# Patient Record
Sex: Male | Born: 1945 | Race: White | Hispanic: Refuse to answer | Marital: Married | Smoking: Never smoker
Health system: Southern US, Community
[De-identification: ages and names within clinical notes are randomized; demographics above are authoritative.]

## PROBLEM LIST (undated history)

## (undated) DIAGNOSIS — K5792 Diverticulitis of intestine, part unspecified, without perforation or abscess without bleeding: Secondary | ICD-10-CM

## (undated) DIAGNOSIS — E119 Type 2 diabetes mellitus without complications: Secondary | ICD-10-CM

## (undated) HISTORY — PX: APPENDECTOMY: SHX54

## (undated) HISTORY — DX: Type 2 diabetes mellitus without complications: E11.9

---

## 2002-05-19 ENCOUNTER — Encounter: Payer: Self-pay | Admitting: Internal Medicine

## 2002-05-19 ENCOUNTER — Encounter: Admission: RE | Admit: 2002-05-19 | Discharge: 2002-05-19 | Payer: Self-pay | Admitting: Internal Medicine

## 2004-05-13 ENCOUNTER — Ambulatory Visit (HOSPITAL_COMMUNITY): Admission: RE | Admit: 2004-05-13 | Discharge: 2004-05-13 | Payer: Self-pay

## 2005-09-16 ENCOUNTER — Encounter: Admission: RE | Admit: 2005-09-16 | Discharge: 2005-09-16 | Payer: Self-pay | Admitting: Rheumatology

## 2007-12-25 ENCOUNTER — Encounter: Admission: RE | Admit: 2007-12-25 | Discharge: 2007-12-25 | Payer: Self-pay | Admitting: Internal Medicine

## 2008-07-08 ENCOUNTER — Encounter: Admission: RE | Admit: 2008-07-08 | Discharge: 2008-07-08 | Payer: Self-pay | Admitting: Orthopedic Surgery

## 2009-05-13 ENCOUNTER — Encounter: Admission: RE | Admit: 2009-05-13 | Discharge: 2009-05-13 | Payer: Self-pay | Admitting: Sports Medicine

## 2009-12-18 HISTORY — PX: TOTAL KNEE ARTHROPLASTY: SHX125

## 2011-03-09 ENCOUNTER — Other Ambulatory Visit: Payer: Self-pay | Admitting: Orthopedic Surgery

## 2011-03-09 ENCOUNTER — Encounter (HOSPITAL_COMMUNITY): Payer: 59 | Attending: Orthopedic Surgery

## 2011-03-09 DIAGNOSIS — Z79899 Other long term (current) drug therapy: Secondary | ICD-10-CM | POA: Insufficient documentation

## 2011-03-09 DIAGNOSIS — Z01812 Encounter for preprocedural laboratory examination: Secondary | ICD-10-CM | POA: Insufficient documentation

## 2011-03-09 LAB — CBC
HCT: 38.6 % — ABNORMAL LOW (ref 39.0–52.0)
Hemoglobin: 12.1 g/dL — ABNORMAL LOW (ref 13.0–17.0)
MCH: 26.9 pg (ref 26.0–34.0)
MCHC: 31.3 g/dL (ref 30.0–36.0)
MCV: 85.8 fL (ref 78.0–100.0)
Platelets: 252 10*3/uL (ref 150–400)
RBC: 4.5 MIL/uL (ref 4.22–5.81)
RDW: 14.2 % (ref 11.5–15.5)
WBC: 6.1 10*3/uL (ref 4.0–10.5)

## 2011-03-09 LAB — SURGICAL PCR SCREEN: MRSA, PCR: NEGATIVE

## 2011-03-09 LAB — DIFFERENTIAL
Basophils Absolute: 0 10*3/uL (ref 0.0–0.1)
Eosinophils Absolute: 0.1 10*3/uL (ref 0.0–0.7)
Eosinophils Relative: 2 % (ref 0–5)

## 2011-03-09 LAB — BASIC METABOLIC PANEL
CO2: 27 mEq/L (ref 19–32)
Chloride: 105 mEq/L (ref 96–112)
Creatinine, Ser: 1.11 mg/dL (ref 0.4–1.5)
GFR calc Af Amer: >60 mL/min (ref >60)

## 2011-03-09 LAB — URINALYSIS, ROUTINE W REFLEX MICROSCOPIC
Glucose, UA: 100 mg/dL — AB
Ketones, ur: NEGATIVE mg/dL
Nitrite: NEGATIVE
Protein, ur: NEGATIVE mg/dL

## 2011-03-09 NOTE — H&P (Signed)
Bradley Gilmore, Bradley Gilmore             ACCOUNT NO.:  [id number]  MEDICAL RECORD NO.:  [id number]           PATIENT TYPE:  I  LOCATION:  1S                           FACILITY:  [facility]  PHYSICIAN:  Madlyn Frankel. Charlann Boxer, M.D.  DATE OF BIRTH:  1946-07-28  DATE OF ADMISSION: DATE OF DISCHARGE:                             HISTORY & PHYSICAL   ADMITTING DIAGNOSIS:  Left knee osteoarthritis.  HISTORY:  Mr. Messer is a 65 year old gentleman seen and evaluated for left knee osteoarthritis.  He had been seen and evaluated for this in the past and is here for surgical consideration after previous MRI revealed some meniscal pathology.  His radiographs revealed advanced degenerative changes with bone on bone arthritis.  I do not think that any other surgery or the knee arthroplasty will be of benefit.  After reviewing with him, he wished to proceed in this fashion as he was not getting better.  He was subsequently medically cleared.  PAST MEDICAL HISTORY:  Diabetes.  PAST SURGICAL HISTORY: 1. Appendectomy. 2. Orbital repair. 3. Cataract surgery. 4. Wound and shrapnel injuries as well as repair and thumb     reattachment. 5. History of left knee arthroscopy.  FAMILY MEDICAL HISTORY:  Hypertension and diabetes.  CURRENT MEDICATIONS:  Actos, metformin, aspirin, glimepiride, simvastatin, diclofenac.  DRUG ALLERGIES:  No known drug allergies.  PRIMARY CARE PHYSICIAN:  Dr. Benjaman Kindler.  SOCIAL HISTORY:  He denies smoking and alcohol.  The patient had been seen and evaluated by his primary physician in December 2011 and since that time had no major complicating issues.  No headaches, dizziness, blurred vision, slurred speech.  No rhinitis, productive cough, hemoptysis, no chest pains, no abdominal discomfort. No nausea, vomiting, burning or frequent urination.  No constipation or diarrhea.  His only major issue at this point was his left knee pain.  PHYSICAL EXAMINATION:  The patient was seen  and evaluated in the office. VITAL SIGNS:  Noted to have a pulse of 78, blood pressure of 142/80. GENERAL:  Otherwise, he is awake, alert, oriented, cooperative.  Denies any assist device for ambulation. HEAD AND NECK EXAM:  Normal with no slurred speech or obvious vision issues. CHEST:  His chest was clear to auscultation. HEART:  His heart had regular rate and rhythm. ABDOMEN:  His abdomen was soft and nontender. EXTREMITY EXAMINATION:  Revealed slight genu varum to this left knee with tenderness medially.  He had near full extension with some crepitation and pain.  His knee ligaments were otherwise stable.  He was neurovascularly intact in the left lower extremity.  RADIOGRAPHS:  Radiographs and EKG performed at [facility] are pending. Radiographs from our office of his knees are available for presurgical consideration with advanced left knee osteoarthritis.  ASSESSMENT:  Advanced left knee osteoarthritis.  PLAN:  The patient will be admitted for same-day surgery on March 20, 2011.  After reviewing with him his current situation as well as his insurance options, we will plan on him doing outpatient physical therapy when he is discharged to home.  He will be placed on postoperative medication including pain medicine. His prescriptions were provided for standard  Colace, MiraLax, iron, Xarelto, Robaxin.  He will receive pain medicine in the hospital. Questions were encouraged, answered and reviewed with him.     Madlyn Frankel Charlann Boxer, M.D.     MDO/MEDQ  D:  03/08/2011  T:  03/09/2011  Job:  [id number]  Electronically Signed by Durene Romans M.D. on 03/09/2011 01:31:49 PM

## 2011-03-10 ENCOUNTER — Other Ambulatory Visit (HOSPITAL_COMMUNITY): Payer: Self-pay

## 2011-03-20 ENCOUNTER — Inpatient Hospital Stay (HOSPITAL_COMMUNITY)
Admission: RE | Admit: 2011-03-20 | Discharge: 2011-03-23 | DRG: 470 | Disposition: A | Discharge status: Home Health | Payer: 59 | Source: Ambulatory Visit | Attending: Orthopedic Surgery | Admitting: Orthopedic Surgery

## 2011-03-20 DIAGNOSIS — M1710 Unilateral primary osteoarthritis, unspecified knee: Principal | ICD-10-CM | POA: Diagnosis present

## 2011-03-20 DIAGNOSIS — E119 Type 2 diabetes mellitus without complications: Secondary | ICD-10-CM | POA: Diagnosis present

## 2011-03-20 DIAGNOSIS — Z01812 Encounter for preprocedural laboratory examination: Secondary | ICD-10-CM

## 2011-03-20 LAB — GLUCOSE, CAPILLARY
Glucose-Capillary: 155 mg/dL — ABNORMAL HIGH (ref 70–99)
Glucose-Capillary: 127 mg/dL — ABNORMAL HIGH (ref 70–99)

## 2011-03-20 LAB — TYPE AND SCREEN
ABO/RH(D): A POS
Antibody Screen: NEGATIVE

## 2011-03-20 LAB — ABO/RH: ABO/RH(D): A POS

## 2011-03-21 LAB — BASIC METABOLIC PANEL
BUN: 12 mg/dL (ref 6–23)
Chloride: 107 mEq/L (ref 96–112)
Creatinine, Ser: 1.05 mg/dL (ref 0.4–1.5)

## 2011-03-21 LAB — GLUCOSE, CAPILLARY
Glucose-Capillary: 169 mg/dL — ABNORMAL HIGH (ref 70–99)
Glucose-Capillary: 157 mg/dL — ABNORMAL HIGH (ref 70–99)
Glucose-Capillary: 174 mg/dL — ABNORMAL HIGH (ref 70–99)

## 2011-03-21 LAB — CBC
MCH: 27.2 pg (ref 26.0–34.0)
MCV: 86.4 fL (ref 78.0–100.0)
Platelets: 197 10*3/uL (ref 150–400)
RBC: 3.75 MIL/uL — ABNORMAL LOW (ref 4.22–5.81)
RDW: 14.2 % (ref 11.5–15.5)

## 2011-03-22 LAB — GLUCOSE, CAPILLARY
Glucose-Capillary: 155 mg/dL — ABNORMAL HIGH (ref 70–99)
Glucose-Capillary: 162 mg/dL — ABNORMAL HIGH (ref 70–99)

## 2011-03-22 LAB — CBC
HCT: 32.6 % — ABNORMAL LOW (ref 39.0–52.0)
Platelets: 204 10*3/uL (ref 150–400)
RDW: 14.1 % (ref 11.5–15.5)
WBC: 7.7 10*3/uL (ref 4.0–10.5)

## 2011-03-22 LAB — BASIC METABOLIC PANEL
GFR calc Af Amer: >60 mL/min (ref >60)
GFR calc non Af Amer: >60 mL/min (ref >60)
Potassium: 4.4 mEq/L (ref 3.5–5.1)
Sodium: 136 mEq/L (ref 135–145)

## 2011-03-25 NOTE — Discharge Summary (Signed)
Bradley Gilmore, Bradley Gilmore             ACCOUNT NO.:  [id number]  MEDICAL RECORD NO.:  [id number]           PATIENT TYPE:  I  LOCATION:  1604                         FACILITY:  [facility]  PHYSICIAN:  Madlyn Frankel. Charlann Boxer, M.D.  DATE OF BIRTH:  04-12-1946  DATE OF ADMISSION:  03/20/2011 DATE OF DISCHARGE:  03/22/2011                              DISCHARGE SUMMARY   ADMITTING DIAGNOSIS:  Left knee osteoarthritis.  DISCHARGE DIAGNOSES: 1. Left knee osteoarthritis, status post left total knee replacement,     March 20, 2011. 2. Diabetes.  ADMITTING HISTORY:  Bradley Gilmore is a pleasant 66 year old gentleman who presented to the office for evaluation of advanced left knee osteoarthritis and pain.  He had failed conservative measures.  After reviewing with him the surgical options available, he wished to proceed with knee arthroplasty.  Risks and benefits were discussed and reviewed, consent was obtained for benefit of pain relief.  HOSPITAL COURSE:  The patient was admitted for same-day surgery on March 20, 2011.  He underwent a left total knee replacement.  Please see dictated operative note for full details of the procedure. Postoperatively after a brief stay in recovery room, he was transferred to the orthopedic ward where he remained throughout his hospital stay.  On postop day #1, his Hemovac drain was removed, after 24 hours his Foley was removed.  He was seen and evaluated by Physical Therapy.  He was placed on a diabetic diet.  By postop day #1, his hematocrit was 32.4; on day #2, it was 32.6.  His electrolytes remained stable. He was seen and evaluated by Physical Therapy and prepared for discharge on day #2.  DISCHARGE INSTRUCTIONS:  He will be discharged home with home health physical therapy to work on range of motion, strengthening, gait training.  He will work on Banker range of motion and strength.  He may shower.  His current Aquacel dressing will remain in place for 8 days,  it is waterproof and he may shower with it on.  Dry dressings will otherwise be applied.  He will return to see Dr. Durene Romans at [facility], at [phone], in 2 weeks' times.  If he has any questions they can be addressed at our office.  DISCHARGE MEDICATIONS: 1. Colace 100 mg p.o. b.i.d. for constipation while on pain medicines. 2. Aspirin 325 mg p.o. b.i.d. for 30 days and he will return to his 81     mg dose. 3. Robaxin 500 mg p.o. q.6 h. as needed for muscle spasm and pain. 4. Iron 325 mg p.o. 2-3 times a day as needed for 1-2 weeks. 5. Oxycodone 5 mg 1-3 tablets every 4-6 hours as needed for pain. 6. MiraLax 17 g p.o. daily as needed for constipation while on pain     medicines. 7. Actos 45 mg p.o. q.a.m. 8. Metformin 500 mg 2 tablets b.i.d. 9. Omeprazole 20 mg daily. 10.Simvastatin 20 mg nightly. 11.Vitamin D2 50,000 units weekly.     Madlyn Frankel Charlann Boxer, M.D.     MDO/MEDQ  D:  03/22/2011  T:  03/23/2011  Job:  [id number]  Electronically Signed by Durene Romans  M.D. on 03/25/2011 10:00:12 AM

## 2011-03-25 NOTE — Op Note (Signed)
NAMEMONTELL, LEOPARD             ACCOUNT NO.:  [id number]  MEDICAL RECORD NO.:  [id number]           PATIENT TYPE:  I  LOCATION:  0005                         FACILITY:  [facility]  PHYSICIAN:  Madlyn Frankel. Charlann Boxer, M.D.  DATE OF BIRTH:  [birth year]  DATE OF PROCEDURE:  03/20/2011 DATE OF DISCHARGE:                              OPERATIVE REPORT   PREOPERATIVE DIAGNOSES:  Left knee osteoarthritis.  POSTOPERATIVE DIAGNOSIS:  Left knee osteoarthritis.  PROCEDURE:  Left total-knee replacement.  COMPONENTS USED:  DePuy rotating platform posterior stabilized knee system with a size 4 femur, 4 tibia, 10-mm insert and a 41 patellar button.  SURGEON:  Madlyn Frankel. Charlann Boxer, M.D.  ASSISTANT:  Jaquelyn Bitter. Chabon, PA-C  ANESTHESIA:  Spinal.  SPECIMENS:  None.  COMPLICATIONS:  None.  DRAINS:  One Hemovac.  TOURNIQUET TIME:  40 minutes at 250 mmHg.  ESTIMATED BLOOD LOSS:  About 50 cc.  INDICATIONS OF THE PROCEDURE:  Mr. Richison is a 65 year old gentleman who presented to the office for evaluation of left knee pain.  Radiographs revealed degenerative changes.  He had failed to respond to conservative measures and is wishing to proceed with more definitive measures.  We discussed options of partial versus total-knee replacement and he wished to proceed with total-knee replacement.  The risks of infection, DVT, component failure, and need for revision surgery were all reviewed in the setting of the potential for benefit of pain relief.  Consent was obtained.  PROCEDURE IN DETAIL:  The patient was brought to the operative theater. Once adequate anesthesia, preoperative antibiotics, Ancef administered as well as tranexamic acid for perioperative blood loss, he was positioned supine with a right thigh tourniquet placed.  The right lower extremity was then prepped and draped in the sterile fashion with his right leg placed in the [facility] leg holder.  A time-out was performed identifying the patient,  planned procedure and extremity.  The leg was exsanguinated.  A midline incision was made followed by a median arthrotomy following initial exposure and debridement.  Attention was directed to the patella.  Precut measurement was 23 mm, resected down to about 14 mm and the 41 patellar button seemed to restore the height the best as well as provide the best coverage.  Lug holes were drilled with a metal shim placed to protect the patella from retractors and saw blades.  Attention was now directed to the femur.  The femoral canal was opened with the drill and irrigated to try to prevent fat emboli.  An intramedullary rod was passed and at 5 degrees of valgus 10 mm of bone was resected off the distal femur.  Following this resection, the tibia was subluxated anteriorly and using an extramedullary guide I had a measured resection of 10 mm off the proximal lateral tibia.  The tibial bone was removed in addition to cruciate stumps and remaining meniscus. At this point, we confirmed that the gap was going to be stable with a 10-mm extension block and that the cut was perpendicular in the coronal plane.  Once this was done, I sized the femur to be a size 4 in the anterior and  posterior dimension.  The size 4 rotation block was pinned into position anterior reference using a C-clamp off the proximal tibia to set rotation.  The 4-in-1 cutting block was placed after adjusting it and dropping it down a bit posterior in an effort to improve flexion gap.  The anterior, posterior and chamfer cuts were then made without difficulty nor notching.  Final box cut was made off the lateral aspect of the distal femur.  The tibia was subluxating again anteriorly.  The size 4 tray seemed to fit best, particularly after removing medial osteophytes.  It was pinned into position, drilled and keel punched.  A trial reduction was carried out with a 4 femur, 4 tibia, and a 10-mm insert.  The knee was noted to come  to full extension with stable ligaments and the patella tracked through the trochlea without the application of pressure.  Given these findings, all trial components were removed.  The synovial capsule junction of the knee was debrided as well as injected with 0.25% Marcaine with epinephrine and 1 cc of Toradol.  The knee was irrigated with normal saline solution as the final components were opened and the cement mixed.  The final components were then cemented onto clean and dried cut surface of the bone.  Extruded cement was removed.  Once the cement had fully cured, the final 10-mm insert was chosen and inserted into the knee. The knee was re-irrigated with normal saline solution.  The tourniquet had been let down at 40 minutes without significant hemostasis required. A medium Hemovac drain was placed deep.  The extensor mechanism was then reapproximated with the knee in flexion.  The remainder of the wound was closed in layers with 2-0 Vicryl and running 4-0 Monocryl.  The knee was cleaned, dried and dressed sterilely with an Aquacel dressing.  The drain site was dressed separately.  He was then brought to the recovery room in stable condition, tolerating the procedure well.     Madlyn Frankel Charlann Boxer, M.D.     MDO/MEDQ  D:  03/20/2011  T:  03/20/2011  Job:  [id number]  Electronically Signed by Durene Romans M.D. on 03/25/2011 10:00:08 AM

## 2011-05-28 ENCOUNTER — Emergency Department (HOSPITAL_COMMUNITY): Payer: 59

## 2011-05-28 ENCOUNTER — Emergency Department (HOSPITAL_COMMUNITY)
Admission: EM | Admit: 2011-05-28 | Discharge: 2011-05-28 | Disposition: A | Discharge status: Home / Self Care | Payer: 59 | Attending: Emergency Medicine | Admitting: Emergency Medicine

## 2011-05-28 DIAGNOSIS — T148XXA Other injury of unspecified body region, initial encounter: Secondary | ICD-10-CM | POA: Insufficient documentation

## 2011-05-28 DIAGNOSIS — Z96659 Presence of unspecified artificial knee joint: Secondary | ICD-10-CM | POA: Insufficient documentation

## 2011-05-28 DIAGNOSIS — X58XXXA Exposure to other specified factors, initial encounter: Secondary | ICD-10-CM | POA: Insufficient documentation

## 2011-05-28 DIAGNOSIS — M79609 Pain in unspecified limb: Secondary | ICD-10-CM | POA: Insufficient documentation

## 2011-05-28 DIAGNOSIS — M7989 Other specified soft tissue disorders: Secondary | ICD-10-CM | POA: Insufficient documentation

## 2011-12-07 ENCOUNTER — Other Ambulatory Visit: Payer: Self-pay | Admitting: Internal Medicine

## 2011-12-07 DIAGNOSIS — J329 Chronic sinusitis, unspecified: Secondary | ICD-10-CM

## 2011-12-08 ENCOUNTER — Ambulatory Visit
Admission: RE | Admit: 2011-12-08 | Discharge: 2011-12-08 | Disposition: A | Discharge status: Home / Self Care | Payer: 59 | Source: Ambulatory Visit | Attending: Internal Medicine | Admitting: Internal Medicine

## 2011-12-08 DIAGNOSIS — J329 Chronic sinusitis, unspecified: Secondary | ICD-10-CM

## 2012-07-23 ENCOUNTER — Encounter (HOSPITAL_COMMUNITY): Payer: Self-pay | Admitting: *Deleted

## 2012-07-23 ENCOUNTER — Emergency Department (HOSPITAL_COMMUNITY): Payer: 59

## 2012-07-23 ENCOUNTER — Emergency Department (HOSPITAL_COMMUNITY)
Admission: EM | Admit: 2012-07-23 | Discharge: 2012-07-23 | Disposition: A | Discharge status: Home / Self Care | Payer: 59 | Attending: Emergency Medicine | Admitting: Emergency Medicine

## 2012-07-23 DIAGNOSIS — N200 Calculus of kidney: Secondary | ICD-10-CM | POA: Insufficient documentation

## 2012-07-23 DIAGNOSIS — Z7982 Long term (current) use of aspirin: Secondary | ICD-10-CM | POA: Insufficient documentation

## 2012-07-23 DIAGNOSIS — Z9089 Acquired absence of other organs: Secondary | ICD-10-CM | POA: Insufficient documentation

## 2012-07-23 DIAGNOSIS — Z79899 Other long term (current) drug therapy: Secondary | ICD-10-CM | POA: Insufficient documentation

## 2012-07-23 DIAGNOSIS — R109 Unspecified abdominal pain: Secondary | ICD-10-CM | POA: Insufficient documentation

## 2012-07-23 DIAGNOSIS — E119 Type 2 diabetes mellitus without complications: Secondary | ICD-10-CM | POA: Insufficient documentation

## 2012-07-23 HISTORY — DX: Diverticulitis of intestine, part unspecified, without perforation or abscess without bleeding: K57.92

## 2012-07-23 LAB — URINALYSIS, ROUTINE W REFLEX MICROSCOPIC
Bilirubin Urine: NEGATIVE
Nitrite: NEGATIVE
Protein, ur: NEGATIVE mg/dL
Specific Gravity, Urine: 1.025 (ref 1.005–1.030)
Urobilinogen, UA: 0.2 mg/dL (ref 0.0–1.0)

## 2012-07-23 LAB — POCT I-STAT, CHEM 8
Calcium, Ion: 1.21 mmol/L (ref 1.13–1.30)
Chloride: 107 mEq/L (ref 96–112)
Glucose, Bld: 243 mg/dL — ABNORMAL HIGH (ref 70–99)
HCT: 35 % — ABNORMAL LOW (ref 39.0–52.0)
TCO2: 21 mmol/L (ref 0–100)

## 2012-07-23 LAB — CBC
Hemoglobin: 11 g/dL — ABNORMAL LOW (ref 13.0–17.0)
MCH: 24.9 pg — ABNORMAL LOW (ref 26.0–34.0)
MCV: 78.9 fL (ref 78.0–100.0)
RBC: 4.41 MIL/uL (ref 4.22–5.81)
WBC: 8.3 10*3/uL (ref 4.0–10.5)

## 2012-07-23 LAB — COMPREHENSIVE METABOLIC PANEL
ALT: 12 U/L (ref 0–53)
CO2: 23 mEq/L (ref 19–32)
Calcium: 9.2 mg/dL (ref 8.4–10.5)
Chloride: 105 mEq/L (ref 96–112)
Creatinine, Ser: 1.51 mg/dL — ABNORMAL HIGH (ref 0.50–1.35)
GFR calc Af Amer: 54 mL/min — ABNORMAL LOW (ref >90)
GFR calc non Af Amer: 47 mL/min — ABNORMAL LOW (ref >90)
Glucose, Bld: 251 mg/dL — ABNORMAL HIGH (ref 70–99)
Total Bilirubin: 0.3 mg/dL (ref 0.3–1.2)

## 2012-07-23 LAB — URINE MICROSCOPIC-ADD ON

## 2012-07-23 MED ORDER — SODIUM CHLORIDE 0.9 % IV SOLN
INTRAVENOUS | Status: DC
  Administered 2012-07-23: 05:00:00 via INTRAVENOUS

## 2012-07-23 MED ORDER — TAMSULOSIN HCL 0.4 MG PO CAPS: 0.4000 mg | ORAL_CAPSULE | Freq: Every day | ORAL | Status: DC

## 2012-07-23 MED ORDER — ONDANSETRON HCL 4 MG PO TABS: 4.0000 mg | ORAL_TABLET | Freq: Four times a day (QID) | ORAL | Status: DC

## 2012-07-23 MED ORDER — ONDANSETRON HCL 4 MG/2ML IJ SOLN
4.0000 mg | Freq: Once | INTRAMUSCULAR | Status: AC
  Administered 2012-07-23: 4 mg via INTRAVENOUS
  Filled 2012-07-23: qty 2

## 2012-07-23 MED ORDER — HYDROMORPHONE HCL PF 1 MG/ML IJ SOLN
1.0000 mg | Freq: Once | INTRAMUSCULAR | Status: AC
  Administered 2012-07-23: 1 mg via INTRAVENOUS
  Filled 2012-07-23: qty 1

## 2012-07-23 MED ORDER — OXYCODONE-ACETAMINOPHEN 5-325 MG PO TABS: 2.0000 | ORAL_TABLET | ORAL | Status: DC | PRN

## 2012-07-23 MED ORDER — SODIUM CHLORIDE 0.9 % IV BOLUS (SEPSIS)
1000.0000 mL | Freq: Once | INTRAVENOUS | Status: AC
  Administered 2012-07-23: 1000 mL via INTRAVENOUS

## 2012-07-23 NOTE — ED Notes (Signed)
Pt c/o acute onset abd pain that awoke him from his sleep at 2 am.  Pt with dx with diverticulitis to RLQ 2 months ago.  Pt denies emesis or constipation, but states loose stool x 3.

## 2012-07-24 ENCOUNTER — Inpatient Hospital Stay (HOSPITAL_COMMUNITY)
Admission: EM | Admit: 2012-07-24 | Discharge: 2012-07-25 | DRG: 694 | Disposition: A | Discharge status: Home / Self Care | Payer: 59 | Attending: Urology | Admitting: Urology

## 2012-07-24 ENCOUNTER — Encounter (HOSPITAL_COMMUNITY): Payer: Self-pay | Admitting: Emergency Medicine

## 2012-07-24 DIAGNOSIS — N201 Calculus of ureter: Principal | ICD-10-CM | POA: Diagnosis present

## 2012-07-24 DIAGNOSIS — N200 Calculus of kidney: Secondary | ICD-10-CM

## 2012-07-24 DIAGNOSIS — E119 Type 2 diabetes mellitus without complications: Secondary | ICD-10-CM | POA: Diagnosis present

## 2012-07-24 DIAGNOSIS — Z8719 Personal history of other diseases of the digestive system: Secondary | ICD-10-CM

## 2012-07-24 DIAGNOSIS — N1330 Unspecified hydronephrosis: Secondary | ICD-10-CM | POA: Diagnosis present

## 2012-07-24 DIAGNOSIS — Z96659 Presence of unspecified artificial knee joint: Secondary | ICD-10-CM

## 2012-07-24 DIAGNOSIS — N179 Acute kidney failure, unspecified: Secondary | ICD-10-CM | POA: Diagnosis present

## 2012-07-24 LAB — URINALYSIS, ROUTINE W REFLEX MICROSCOPIC
Glucose, UA: NEGATIVE mg/dL
Hgb urine dipstick: NEGATIVE
Leukocytes, UA: NEGATIVE
pH: 5.5 (ref 5.0–8.0)

## 2012-07-24 LAB — COMPREHENSIVE METABOLIC PANEL
ALT: 10 U/L (ref 0–53)
AST: 15 U/L (ref 0–37)
CO2: 22 mEq/L (ref 19–32)
Calcium: 9.2 mg/dL (ref 8.4–10.5)
Creatinine, Ser: 1.9 mg/dL — ABNORMAL HIGH (ref 0.50–1.35)
GFR calc Af Amer: 41 mL/min — ABNORMAL LOW (ref >90)
GFR calc non Af Amer: 35 mL/min — ABNORMAL LOW (ref >90)
Sodium: 129 mEq/L — ABNORMAL LOW (ref 135–145)
Total Protein: 7.2 g/dL (ref 6.0–8.3)

## 2012-07-24 LAB — CBC WITH DIFFERENTIAL/PLATELET
Basophils Absolute: 0 10*3/uL (ref 0.0–0.1)
Basophils Relative: 0 % (ref 0–1)
Eosinophils Absolute: 0 10*3/uL (ref 0.0–0.7)
Eosinophils Relative: 0 % (ref 0–5)
HCT: 34.4 % — ABNORMAL LOW (ref 39.0–52.0)
Hemoglobin: 11.1 g/dL — ABNORMAL LOW (ref 13.0–17.0)
Lymphocytes Relative: 13 % (ref 12–46)
Lymphs Abs: 1.3 10*3/uL (ref 0.7–4.0)
MCH: 25.2 pg — ABNORMAL LOW (ref 26.0–34.0)
MCHC: 32.3 g/dL (ref 30.0–36.0)
MCV: 78 fL (ref 78.0–100.0)
Monocytes Absolute: 0.7 10*3/uL (ref 0.1–1.0)
Monocytes Relative: 7 % (ref 3–12)
Neutro Abs: 8.1 10*3/uL — ABNORMAL HIGH (ref 1.7–7.7)
Neutrophils Relative %: 80 % — ABNORMAL HIGH (ref 43–77)
Platelets: 251 10*3/uL (ref 150–400)
RBC: 4.41 MIL/uL (ref 4.22–5.81)
RDW: 15.8 % — ABNORMAL HIGH (ref 11.5–15.5)
WBC: 10.2 10*3/uL (ref 4.0–10.5)

## 2012-07-24 MED ORDER — SODIUM CHLORIDE 0.9 % IV SOLN
Freq: Once | INTRAVENOUS | Status: AC
  Administered 2012-07-24: 23:00:00 via INTRAVENOUS

## 2012-07-24 MED ORDER — ONDANSETRON HCL 4 MG/2ML IJ SOLN
4.0000 mg | Freq: Once | INTRAMUSCULAR | Status: AC
  Administered 2012-07-24: 4 mg via INTRAVENOUS
  Filled 2012-07-24: qty 2

## 2012-07-24 MED ORDER — FENTANYL CITRATE 0.05 MG/ML IJ SOLN
50.0000 ug | Freq: Once | INTRAMUSCULAR | Status: AC
  Administered 2012-07-24: 50 ug via INTRAVENOUS
  Filled 2012-07-24: qty 2

## 2012-07-24 NOTE — ED Notes (Signed)
Pt alert, arrives from home, c/o right flank pain and fever, onset a few days ago, instructed to come to ED per Urology, resp even unlabored, skin pwd

## 2012-07-25 ENCOUNTER — Encounter (HOSPITAL_COMMUNITY)
Admission: EM | Disposition: A | Discharge status: Home / Self Care | Payer: Self-pay | Source: Home / Self Care | Attending: Urology

## 2012-07-25 ENCOUNTER — Emergency Department (HOSPITAL_COMMUNITY): Payer: 59 | Admitting: Anesthesiology

## 2012-07-25 ENCOUNTER — Encounter (HOSPITAL_COMMUNITY): Payer: Self-pay | Admitting: Anesthesiology

## 2012-07-25 HISTORY — PX: CYSTOSCOPY/RETROGRADE/URETEROSCOPY: SHX5316

## 2012-07-25 LAB — GLUCOSE, CAPILLARY: Glucose-Capillary: 141 mg/dL — ABNORMAL HIGH (ref 70–99)

## 2012-07-25 LAB — BASIC METABOLIC PANEL
BUN: 18 mg/dL (ref 6–23)
Chloride: 99 mEq/L (ref 96–112)
Creatinine, Ser: 1.7 mg/dL — ABNORMAL HIGH (ref 0.50–1.35)
GFR calc Af Amer: 47 mL/min — ABNORMAL LOW (ref >90)
GFR calc non Af Amer: 41 mL/min — ABNORMAL LOW (ref >90)
Potassium: 4.4 mEq/L (ref 3.5–5.1)

## 2012-07-25 SURGERY — CYSTOSCOPY, WITH STENT INSERTION
Anesthesia: General | Site: Ureter | Laterality: Right | Wound class: Clean Contaminated

## 2012-07-25 MED ORDER — CIPROFLOXACIN HCL 250 MG PO TABS: 250.0000 mg | ORAL_TABLET | Freq: Two times a day (BID) | ORAL | Status: AC

## 2012-07-25 MED ORDER — CEFAZOLIN SODIUM-DEXTROSE 2-3 GM-% IV SOLR
INTRAVENOUS | Status: AC
  Filled 2012-07-25: qty 50

## 2012-07-25 MED ORDER — CEFAZOLIN SODIUM-DEXTROSE 2-3 GM-% IV SOLR: 2.0000 g | Freq: Once | INTRAVENOUS | Status: DC

## 2012-07-25 MED ORDER — PANTOPRAZOLE SODIUM 40 MG PO TBEC: 40.0000 mg | DELAYED_RELEASE_TABLET | Freq: Every day | ORAL | Status: DC

## 2012-07-25 MED ORDER — LACTATED RINGERS IR SOLN
Status: DC | PRN
  Administered 2012-07-25: 2000 mL

## 2012-07-25 MED ORDER — LACTATED RINGERS IV SOLN
INTRAVENOUS | Status: DC | PRN
  Administered 2012-07-25: 02:00:00 via INTRAVENOUS

## 2012-07-25 MED ORDER — LIDOCAINE HCL (CARDIAC) 20 MG/ML IV SOLN
INTRAVENOUS | Status: DC | PRN
  Administered 2012-07-25: 75 mg via INTRAVENOUS

## 2012-07-25 MED ORDER — SODIUM CHLORIDE 0.9 % IV SOLN: INTRAVENOUS | Status: DC

## 2012-07-25 MED ORDER — OXYCODONE-ACETAMINOPHEN 5-325 MG PO TABS: 1.0000 | ORAL_TABLET | ORAL | Status: DC | PRN

## 2012-07-25 MED ORDER — GLIMEPIRIDE 2 MG PO TABS
2.0000 mg | ORAL_TABLET | Freq: Every day | ORAL | Status: DC
  Administered 2012-07-25: 2 mg via ORAL
  Filled 2012-07-25 (×2): qty 1

## 2012-07-25 MED ORDER — ASPIRIN 81 MG PO CHEW
81.0000 mg | CHEWABLE_TABLET | Freq: Every day | ORAL | Status: DC
  Administered 2012-07-25: 81 mg via ORAL
  Filled 2012-07-25: qty 1

## 2012-07-25 MED ORDER — BELLADONNA ALKALOIDS-OPIUM 16.2-60 MG RE SUPP
RECTAL | Status: DC | PRN
  Administered 2012-07-25: 1 via RECTAL

## 2012-07-25 MED ORDER — MIDAZOLAM HCL 5 MG/5ML IJ SOLN
INTRAMUSCULAR | Status: DC | PRN
  Administered 2012-07-25: 1 mg via INTRAVENOUS

## 2012-07-25 MED ORDER — SODIUM CHLORIDE 0.9 % IV SOLN
INTRAVENOUS | Status: DC
  Administered 2012-07-25: 03:00:00 via INTRAVENOUS

## 2012-07-25 MED ORDER — BELLADONNA ALKALOIDS-OPIUM 16.2-60 MG RE SUPP
RECTAL | Status: AC
  Filled 2012-07-25: qty 1

## 2012-07-25 MED ORDER — CEFAZOLIN SODIUM 1-5 GM-% IV SOLN
INTRAVENOUS | Status: DC | PRN
  Administered 2012-07-25: 2 g via INTRAVENOUS

## 2012-07-25 MED ORDER — ACETAMINOPHEN 325 MG PO TABS: 650.0000 mg | ORAL_TABLET | ORAL | Status: DC | PRN

## 2012-07-25 MED ORDER — ONDANSETRON HCL 4 MG/2ML IJ SOLN: 4.0000 mg | Freq: Three times a day (TID) | INTRAMUSCULAR | Status: DC | PRN

## 2012-07-25 MED ORDER — LIDOCAINE HCL 2 % EX GEL
CUTANEOUS | Status: DC | PRN
  Administered 2012-07-25: 1 via URETHRAL

## 2012-07-25 MED ORDER — PIOGLITAZONE HCL 30 MG PO TABS
30.0000 mg | ORAL_TABLET | Freq: Every day | ORAL | Status: DC
  Administered 2012-07-25: 30 mg via ORAL
  Filled 2012-07-25: qty 1

## 2012-07-25 MED ORDER — ONDANSETRON HCL 4 MG/2ML IJ SOLN: 4.0000 mg | INTRAMUSCULAR | Status: DC | PRN

## 2012-07-25 MED ORDER — TAMSULOSIN HCL 0.4 MG PO CAPS: 0.4000 mg | ORAL_CAPSULE | Freq: Every day | ORAL | Status: DC

## 2012-07-25 MED ORDER — TAMSULOSIN HCL 0.4 MG PO CAPS
0.4000 mg | ORAL_CAPSULE | Freq: Every day | ORAL | Status: DC
  Administered 2012-07-25: 0.4 mg via ORAL
  Filled 2012-07-25 (×2): qty 1

## 2012-07-25 MED ORDER — HYDROMORPHONE HCL PF 1 MG/ML IJ SOLN: 0.2500 mg | INTRAMUSCULAR | Status: DC | PRN

## 2012-07-25 MED ORDER — IOHEXOL 300 MG/ML  SOLN
INTRAMUSCULAR | Status: AC
  Filled 2012-07-25: qty 1

## 2012-07-25 MED ORDER — HYDROMORPHONE HCL PF 1 MG/ML IJ SOLN: 1.0000 mg | INTRAMUSCULAR | Status: DC | PRN

## 2012-07-25 MED ORDER — SIMVASTATIN 20 MG PO TABS
20.0000 mg | ORAL_TABLET | Freq: Every day | ORAL | Status: DC
  Filled 2012-07-25: qty 1

## 2012-07-25 MED ORDER — FENTANYL CITRATE 0.05 MG/ML IJ SOLN
INTRAMUSCULAR | Status: DC | PRN
  Administered 2012-07-25 (×3): 50 ug via INTRAVENOUS

## 2012-07-25 MED ORDER — HYDROMORPHONE HCL PF 1 MG/ML IJ SOLN
1.0000 mg | Freq: Once | INTRAMUSCULAR | Status: AC
  Administered 2012-07-25: 1 mg via INTRAVENOUS
  Filled 2012-07-25: qty 1

## 2012-07-25 MED ORDER — SUCCINYLCHOLINE CHLORIDE 20 MG/ML IJ SOLN
INTRAMUSCULAR | Status: DC | PRN
  Administered 2012-07-25: 120 mg via INTRAVENOUS

## 2012-07-25 MED ORDER — ACETAMINOPHEN 10 MG/ML IV SOLN
INTRAVENOUS | Status: AC
  Filled 2012-07-25: qty 100

## 2012-07-25 MED ORDER — PROPOFOL 10 MG/ML IV EMUL
INTRAVENOUS | Status: DC | PRN
  Administered 2012-07-25: 200 mg via INTRAVENOUS

## 2012-07-25 MED ORDER — MORPHINE SULFATE 4 MG/ML IJ SOLN
6.0000 mg | Freq: Once | INTRAMUSCULAR | Status: AC
  Administered 2012-07-25: 6 mg via INTRAVENOUS
  Filled 2012-07-25: qty 2

## 2012-07-25 MED ORDER — DIATRIZOATE MEGLUMINE 30 % UR SOLN
URETHRAL | Status: DC | PRN
  Administered 2012-07-25: 4 mL via URETHRAL

## 2012-07-25 MED ORDER — ACETAMINOPHEN 10 MG/ML IV SOLN
INTRAVENOUS | Status: DC | PRN
  Administered 2012-07-25: 1000 mg via INTRAVENOUS

## 2012-07-25 MED ORDER — ONDANSETRON HCL 4 MG/2ML IJ SOLN
INTRAMUSCULAR | Status: DC | PRN
  Administered 2012-07-25: 4 mg via INTRAVENOUS

## 2012-07-25 MED ORDER — LIDOCAINE HCL 2 % EX GEL
CUTANEOUS | Status: AC
  Filled 2012-07-25: qty 10

## 2012-07-25 MED ORDER — MORPHINE SULFATE 2 MG/ML IJ SOLN: 2.0000 mg | INTRAMUSCULAR | Status: DC | PRN

## 2012-07-25 SURGICAL SUPPLY — 23 items
ADAPTER CATH URET PLST 4-6FR (CATHETERS) ×3 IMPLANT
ADPR CATH URET STRL DISP 4-6FR (CATHETERS) ×2
BAG URINE DRAINAGE (UROLOGICAL SUPPLIES) ×2 IMPLANT
BAG URO CATCHER STRL LF (DRAPE) ×3 IMPLANT
BASKET ZERO TIP NITINOL 2.4FR (BASKET) IMPLANT
BSKT STON RTRVL ZERO TP 2.4FR (BASKET)
CATH FOLEY 2WAY SLVR  5CC 18FR (CATHETERS) ×1
CATH FOLEY 2WAY SLVR 5CC 18FR (CATHETERS) ×1 IMPLANT
CATH INTERMIT  6FR 70CM (CATHETERS) ×2 IMPLANT
CATH TIEMANN FOLEY 18FR 5CC (CATHETERS) ×2 IMPLANT
CLOTH BEACON ORANGE TIMEOUT ST (SAFETY) ×3 IMPLANT
DRAPE CAMERA CLOSED 9X96 (DRAPES) ×3 IMPLANT
GLOVE BIOGEL M STRL SZ7.5 (GLOVE) ×5 IMPLANT
GOWN STRL NON-REIN LRG LVL3 (GOWN DISPOSABLE) ×3 IMPLANT
GOWN STRL REIN XL XLG (GOWN DISPOSABLE) ×3 IMPLANT
GUIDEWIRE ANG ZIPWIRE 038X150 (WIRE) IMPLANT
GUIDEWIRE STR DUAL SENSOR (WIRE) ×3 IMPLANT
MANIFOLD NEPTUNE II (INSTRUMENTS) ×3 IMPLANT
PACK CYSTO (CUSTOM PROCEDURE TRAY) ×3 IMPLANT
SHEATH URET 14/16 FRX35CM (MISCELLANEOUS) ×2 IMPLANT
STENT CONTOUR 6FRX28X.038 (STENTS) ×2 IMPLANT
SYR 20CC LL (SYRINGE) ×2 IMPLANT
TUBING CONNECTING 10 (TUBING) ×3 IMPLANT

## 2012-07-25 NOTE — Anesthesia Preprocedure Evaluation (Signed)
Anesthesia Evaluation  Patient identified by MRN, date of birth, ID band Patient awake  General Assessment Comment:Ate 12 noon  Reviewed: Allergy & Precautions, H&P , NPO status , Patient's Chart, lab work & pertinent test results, reviewed documented beta blocker date and time   Airway Mallampati: II TM Distance: >3 FB Neck ROM: Full    Dental  (+) Teeth Intact and Dental Advisory Given   Pulmonary neg pulmonary ROS,  breath sounds clear to auscultation        Cardiovascular negative cardio ROS  Rhythm:Regular Rate:Normal  Denies cardiac symptoms   Neuro/Psych negative neurological ROS  negative psych ROS   GI/Hepatic negative GI ROS, Neg liver ROS,   Endo/Other  Poorly Controlled, Type 2, Oral Hypoglycemic Agents  Renal/GU Rt ureteral obstruction Cr 1.90  negative genitourinary   Musculoskeletal negative musculoskeletal ROS (+)   Abdominal   Peds negative pediatric ROS (+)  Hematology negative hematology ROS (+) Anemia, Hgb 11.1   Anesthesia Other Findings   Reproductive/Obstetrics negative OB ROS                           Anesthesia Physical Anesthesia Plan  ASA: III  Anesthesia Plan: General   Post-op Pain Management:    Induction: Intravenous, Rapid sequence and Cricoid pressure planned  Airway Management Planned: Oral ETT  Additional Equipment:   Intra-op Plan:   Post-operative Plan: Extubation in OR  Informed Consent: I have reviewed the patients History and Physical, chart, labs and discussed the procedure including the risks, benefits and alternatives for the proposed anesthesia with the patient or authorized representative who has indicated his/her understanding and acceptance.   Dental advisory given  Plan Discussed with: CRNA and Surgeon  Anesthesia Plan Comments:         Anesthesia Quick Evaluation

## 2012-07-25 NOTE — Progress Notes (Signed)
Patient provided with discharge instructions and prescriptions. Patient verbalized understanding. Patient discharged to home. 

## 2012-07-25 NOTE — Discharge Summary (Signed)
Physician Discharge Summary  Patient ID: Bradley Gilmore MRN: [id number] DOB/AGE: 65-02-1946 65 y.o.  Admit date: 07/24/2012 Discharge date: 07/25/2012  Admission Diagnoses: Right ureteral stone, right hydronephrosis, acute renal failure  Discharge Diagnoses:  Right ureteral stone, right hydronephrosis, acute renal failure  Discharged Condition: Good  Hospital Course: Patient was admitted following right ureteroscopy and right ureteral stent placement. He has done well. He is remained afebrile with stable vitals. His tolerating by mouth intake. His pain is much improved although he is having some pain when his bladder empties as is typical of a stent. His electrolytes corrected and his creatinine improved on postop day 1. His urine output was excellent.  Consults: None  Significant Diagnostic Studies:   Treatments: surgery: Right ureteroscopy and right ureteral stent placement  Discharge Exam: Blood pressure 130/56, pulse 80, temperature 98.8 F (37.1 C), temperature source Oral, resp. rate 17, height 6' (1.829 m), weight 106.595 kg (235 lb), SpO2 95.00%. He is in no acute distress   sitting up in bed talking with his family abdomen soft and nontender GU-Foley in place urine with mild hematuria no clots.   Disposition: 01-Home or Self Care   Medication List  As of 07/25/2012 10:03 AM   TAKE these medications         aspirin 81 MG chewable tablet   Chew 81 mg by mouth daily.      ciprofloxacin 250 MG tablet   Commonly known as: CIPRO   Take 1 tablet (250 mg total) by mouth 2 (two) times daily.      glimepiride 4 MG tablet   Commonly known as: AMARYL   Take 4 mg by mouth daily before breakfast.      Melatonin 3 MG Caps   Take 1 capsule by mouth at bedtime as needed. Sleep.      metFORMIN 1000 MG tablet   Commonly known as: GLUCOPHAGE   Take 1,000 mg by mouth 2 (two) times daily with a meal.      omeprazole 20 MG capsule   Commonly known as: PRILOSEC   Take 20 mg by  mouth daily.      ondansetron 4 MG tablet   Commonly known as: ZOFRAN   Take 4 mg by mouth every 6 (six) hours.      oxyCODONE-acetaminophen 5-325 MG per tablet   Commonly known as: PERCOCET/ROXICET   Take 1 tablet by mouth every 4 (four) hours as needed. Pain.      pioglitazone 30 MG tablet   Commonly known as: ACTOS   Take 30 mg by mouth daily.      simvastatin 20 MG tablet   Commonly known as: ZOCOR   Take 20 mg by mouth daily.      Tamsulosin HCl 0.4 MG Caps   Commonly known as: FLOMAX   Take 1 capsule (0.4 mg total) by mouth daily after supper.      Tamsulosin HCl 0.4 MG Caps   Commonly known as: FLOMAX   Take by mouth.           Follow-up Information    Follow up with Mena Goes Lowella Petties, MD in 3 weeks.   Contact information:   [address] [facility] [address] [facility] [place] [address] [phone]          Signed: Antony Haste 07/25/2012, 10:03 AM

## 2012-07-25 NOTE — Op Note (Signed)
Preoperative diagnosis: Right ureteral stone, right hydronephrosis, acute renal failure, right flank pain Postoperative diagnosis: Same Procedure: Exam under anesthesia Cystoscopy Left retrograde pyelogram Left ureteroscopy Left ureteral stent placement  Surgeon: Mena Goes  Anesthesia: Gen  Findings: Exam under anesthesia-the penis and testicles were palpably normal without mass or lesion. On digital rectal exam a B&O suppository was placed. The prostate was mildly enlarged but was smooth and without hard areas or nodules.  Cystoscopy-the urethra was normal the prostate was mildly enlarged and elongated, the bladder was mildly trabeculated, the trigone and ureteral orifice these were in their normal anatomic position, the left ureteral orifice had clear reflux, the right ureteral orifice showed no reflux and had a small stone fragment at the os. The right intramural ureter look heaped up as with the stone in the intramural ureter. After retrograde injection of contrast thick brown proteinaceous material stream from the right ureteral orifice. This same phenomenon was seen after the stent was placed. This brown material emanating from the stent holes.  Right retrograde pyelogram-this revealed a mildly dilated ureter down to the ureterovesical junction with no obvious stent or filling defect. The collecting system and renal pelvis appeared mildly dilated but normal without filling defect.  Right ureteroscopy-the scope would only pass through the intramural ureter to the ureterovesical junction but would not enter the distal ureter. Looking proximally I could see into the ureter but did not see a stone. I tried to dilate the ureterovesical junction with the inner cannula of the access sheath but this would not pass. Therefore a stent was placed.  Description of procedure: After consent was obtained the patient was brought to the operating room. A timeout was performed to confirm the patient and  procedure. After adequate anesthesia the patient was placed in lithotomy position and an exam under anesthesia was performed while placing a B&O suppository. The patient was then prepped and draped in the usual fashion and a cystoscope was passed per urethra. The bladder was inspected. There was no reflux on the right ureteral orifice. The right ureteral orifice was attempted to be cannulated with a 6 [place] open-ended catheter but the catheter would only go in to the ureteral orifice for a couple of millimeters. Retrograde injection of contrast was performed and the catheter removed. After drawing the catheter out the right ureteral orifice again draining thick brown proteinaceous fluid. After this subsided a sensor wire was advanced and cold in the right kidney. The ureteroscope was advanced into the right intramural ureter through the UO and no stone was seen but the scope would not pass into the distal ureter. The scope was removed and the inner cannula of an access sheath was passed but the dilator would not go into the distal ureter. A performed ureteroscopy again but again could not access the distal ureter. I could look up the ureter into the distal ureter and did not see a stone. I suspect the retrograde push the stone back up the ureter. The 6 [place] open-ended catheter was advanced into the right renal pelvis and retrograde contrast was injected again to confirm proper placement of the wire and catheter in the lumen of the collecting system. This outlined a normal collecting system as above. A sensor wire was repassed and coiled in the upper pole. The wire was backloaded on the cystoscope and a 6 x 28 cm stent was advanced under cystoscopic and fluoroscopic guidance with a good coil seen in the kidney in the bladder after the wire removed. An  18 French coud Foley catheter was placed draining clear urine and left to gravity drainage. Patient was then awakened taken to recovery in stable  condition.  Complications: None  Estimated blood loss: Minimal  Drains: 18 French Foley and right 6 x 28 cm ureteral stent  Specimens: None  Disposition: Patient stable to PACU. Be admitted for overnight observation.

## 2012-07-25 NOTE — Anesthesia Postprocedure Evaluation (Signed)
  Anesthesia Post-op Note  Patient: Bradley Gilmore  Procedure(s) Performed: Procedure(s) (LRB): CYSTOSCOPY WITH STENT PLACEMENT (Right) CYSTOSCOPY/RETROGRADE/URETEROSCOPY (Right)  Patient Location: PACU  Anesthesia Type: General  Level of Consciousness: oriented and sedated  Airway and Oxygen Therapy: Patient Spontanous Breathing and Patient connected to nasal cannula oxygen  Post-op Pain: mild  Post-op Assessment: Post-op Vital signs reviewed, Patient's Cardiovascular Status Stable, Respiratory Function Stable and Patent Airway  Post-op Vital Signs: stable  Complications: No apparent anesthesia complications

## 2012-07-25 NOTE — ED Provider Notes (Signed)
History     CSN: [id number]  Arrival date & time 07/24/12  2201   First MD Initiated Contact with Patient 07/25/12 0014      Chief Complaint  Patient presents with  . Flank Pain    (Consider location/radiation/quality/duration/timing/severity/associated sxs/prior treatment) HPI Comments: Diagnosed with kidney stone at [facility] last night.  Pain returned tonight.  Called Urology and was told to come here.    Patient is a 66 y.o. male presenting with flank pain. The history is provided by the patient.  Flank Pain This is a new problem. The current episode started 2 days ago. The problem occurs constantly. The problem has been gradually worsening. Associated symptoms include abdominal pain. Nothing aggravates the symptoms. Nothing relieves the symptoms. He has tried nothing for the symptoms.    Past Medical History  Diagnosis Date  . Diverticulitis   . Diabetes mellitus     Past Surgical History  Procedure Date  . Appendectomy   . Total knee arthroplasty 2011    L    No family history on file.  History  Substance Use Topics  . Smoking status: Never Smoker   . Smokeless tobacco: Not on file  . Alcohol Use: No      Review of Systems  Gastrointestinal: Positive for abdominal pain.  Genitourinary: Positive for flank pain.  All other systems reviewed and are negative.    Allergies  Review of patient's allergies indicates no known allergies.  Home Medications   Current Outpatient Rx  Name Route Sig Dispense Refill  . ASPIRIN 81 MG PO CHEW Oral Chew 81 mg by mouth daily.    Marland Kitchen GLIMEPIRIDE 4 MG PO TABS Oral Take 4 mg by mouth daily before breakfast.    . MELATONIN 3 MG PO CAPS Oral Take 1 capsule by mouth at bedtime as needed. Sleep.    Marland Kitchen METFORMIN HCL 1000 MG PO TABS Oral Take 1,000 mg by mouth 2 (two) times daily with a meal.    . OMEPRAZOLE 20 MG PO CPDR Oral Take 20 mg by mouth daily.    Marland Kitchen ONDANSETRON HCL 4 MG PO TABS Oral Take 4 mg by mouth every 6 (six) hours.      . OXYCODONE-ACETAMINOPHEN 5-325 MG PO TABS Oral Take 1 tablet by mouth every 4 (four) hours as needed. Pain.    Marland Kitchen PIOGLITAZONE HCL 30 MG PO TABS Oral Take 30 mg by mouth daily.    Marland Kitchen SIMVASTATIN 20 MG PO TABS Oral Take 20 mg by mouth daily.    Marland Kitchen TAMSULOSIN HCL 0.4 MG PO CAPS Oral Take by mouth.      BP 139/59  Pulse 85  Temp 98.5 F (36.9 C) (Oral)  Resp 16  Wt 235 lb (106.595 kg)  SpO2 99%  Physical Exam  Nursing note and vitals reviewed. Constitutional: He is oriented to person, place, and time. He appears well-developed and well-nourished.       Appears uncomfortable.  HENT:  Head: Normocephalic and atraumatic.  Neck: Normal range of motion. Neck supple.  Cardiovascular: Normal rate and regular rhythm.   Pulmonary/Chest: Effort normal and breath sounds normal. No respiratory distress. He has no wheezes.  Abdominal: Soft. Bowel sounds are normal. He exhibits no distension. There is no tenderness.  Musculoskeletal: Normal range of motion.  Neurological: He is alert and oriented to person, place, and time.  Skin: Skin is warm.    ED Course  Procedures (including critical care time)  Labs Reviewed  COMPREHENSIVE METABOLIC PANEL -  Abnormal; Notable for the following:    Sodium 129 (*)     Chloride 94 (*)     Glucose, Bld 152 (*)     Creatinine, Ser 1.90 (*)     GFR calc non Af Amer 35 (*)     GFR calc Af Amer 41 (*)     All other components within normal limits  CBC WITH DIFFERENTIAL - Abnormal; Notable for the following:    Hemoglobin 11.1 (*)     HCT 34.4 (*)     MCH 25.2 (*)     RDW 15.8 (*)     Neutrophils Relative 80 (*)     Neutro Abs 8.1 (*)     All other components within normal limits  LIPASE, BLOOD - Abnormal; Notable for the following:    Lipase 10 (*)     All other components within normal limits  URINALYSIS, ROUTINE W REFLEX MICROSCOPIC - Abnormal; Notable for the following:    Ketones, ur 15 (*)     All other components within normal limits   Ct  Abdomen Pelvis Wo Contrast  07/23/2012  *RADIOLOGY REPORT*  Clinical Data: Acute onset right flank pain.  CT ABDOMEN AND PELVIS WITHOUT CONTRAST  Technique:  Multidetector CT imaging of the abdomen and pelvis was performed following the standard protocol without intravenous contrast.  Comparison: None.  Findings: Atelectasis in the lung bases.  2 mm stone in the distal right ureter just above the ureterovesicle junction with mild proximal pyelocaliectasis and ureterectasis. Pararenal and periureteral stranding.  Changes are consistent with moderate obstruction.  There is also a punctate nonobstructing stone in the upper pole of the right kidney.  No stones or obstruction demonstrated on the left.  No bladder stone or bladder wall thickening.  The unenhanced appearance of the liver, spleen, gallbladder, pancreas, adrenal glands, abdominal aorta, and retroperitoneal lymph nodes is unremarkable.  The stomach, small bowel, and colon are not abnormally distended.  Mild prominence of visceral adipose tissues.  No free air or free fluid in the abdomen.  Fatty atrophy of the right rectus abdominous muscle.  Pelvis:  The bladder wall is not thickened.  Prostate gland is mildly enlarged, measuring 4.1 x 4.7 cm.  Fat in the inguinal canals bilaterally.  Multiple calcifications within the visualized penis may represent dystrophic calcification or stones.  No free or loculated pelvic fluid collections.  No evidence of diverticulitis. The appendix is not visualized.  Mild degenerative changes in the lumbar spine.  IMPRESSION: 2 mm stone in the distal right ureter with moderate proximal obstruction.  Original Report Authenticated By: Marlon Pel, M.D.     No diagnosis found.    MDM  The labs show worsening renal function from yesterday with ongoing pain.  I spoke with Dr. Mena Goes who is coming to the ED to see the patient.  It sounds as though he intends to stent the ureter this evening.  Morphine given for  pain.        Geoffery Lyons, MD 07/25/12 [phone]

## 2012-07-25 NOTE — H&P (Signed)
Urology Admission H&P  History of Present Illness: Patient developed acute onset right flank pain that was radiating to the right lower quadrant yesterday. A CT scan rather 2-3 mm distal right ureteral stone with proximal hydroureteronephrosis and perinephric stranding. I reviewed the images. This is his first stone.  He continues to have intermittent and severe right flank pain radiating to the right lower quadrant today. Has had some frequency and urgency but no dysuria. He is voiding with a good stream. He had a temperature earlier to 99.5 and was not feeling well. His creatinine has gone up to 1.9.  Past Medical History  Diagnosis Date  . Diverticulitis   . Diabetes mellitus    Past Surgical History  Procedure Date  . Appendectomy   . Total knee arthroplasty 2011    L    Home Medications:   (Not in a hospital admission) Allergies: No Known Allergies  No family history on file. Social History:  reports that he has never smoked. He does not have any smokeless tobacco history on file. He reports that he does not drink alcohol or use illicit drugs.  Review of Systems  Constitutional: Positive for fever and malaise/fatigue.  HENT: Negative.   Eyes: Negative.   Respiratory: Negative.   Cardiovascular: Negative.   Gastrointestinal: Negative.   Genitourinary: Positive for urgency and frequency.  Musculoskeletal: Positive for back pain.  Skin: Negative.   Neurological: Negative.   Endo/Heme/Allergies: Negative.   Psychiatric/Behavioral: Negative.     Physical Exam:  Vital signs in last 24 hours: Temp:  [98 F (36.7 C)-98.5 F (36.9 C)] 98.5 F (36.9 C) (08/08 0001) Pulse Rate:  [85-98] 85  (08/08 0001) Resp:  [16] 16  (08/08 0001) BP: (139-144)/(59-64) 139/59 mmHg (08/08 0001) SpO2:  [99 %] 99 % (08/08 0001) Weight:  [106.595 kg (235 lb)] 106.595 kg (235 lb) (08/07 2216) Physical Exam  Constitutional: He is oriented to person, place, and time. He appears well-developed  and well-nourished.  HENT:  Head: Normocephalic and atraumatic.  Neck: Neck supple.  Cardiovascular: Normal rate and regular rhythm.   Respiratory: Effort normal. No respiratory distress.  GI: Soft. There is no tenderness.  Musculoskeletal: Normal range of motion.  Neurological: He is alert and oriented to person, place, and time.  Skin: Skin is warm and dry.   patient appears in pain clutching his right side and rocking around in the bed. There is no lower extremity edema.  Laboratory Data:  Results for orders placed during the hospital encounter of 07/24/12 (from the past 24 hour(s))  COMPREHENSIVE METABOLIC PANEL     Status: Abnormal   Collection Time   07/24/12 10:45 PM      Component Value Range   Sodium 129 (*) 135 - 145 mEq/L   Potassium 4.1  3.5 - 5.1 mEq/L   Chloride 94 (*) 96 - 112 mEq/L   CO2 22  19 - 32 mEq/L   Glucose, Bld 152 (*) 70 - 99 mg/dL   BUN 21  6 - 23 mg/dL   Creatinine, Ser 0.45 (*) 0.50 - 1.35 mg/dL   Calcium 9.2  8.4 - 40.9 mg/dL   Total Protein 7.2  6.0 - 8.3 g/dL   Albumin 3.6  3.5 - 5.2 g/dL   AST 15  0 - 37 U/L   ALT 10  0 - 53 U/L   Alkaline Phosphatase 71  39 - 117 U/L   Total Bilirubin 0.5  0.3 - 1.2 mg/dL   GFR calc  non Af Amer 35 (*) >90 mL/min   GFR calc Af Amer 41 (*) >90 mL/min  CBC WITH DIFFERENTIAL     Status: Abnormal   Collection Time   07/24/12 10:45 PM      Component Value Range   WBC 10.2  4.0 - 10.5 K/uL   RBC 4.41  4.22 - 5.81 MIL/uL   Hemoglobin 11.1 (*) 13.0 - 17.0 g/dL   HCT 16.1 (*) 09.6 - 04.5 %   MCV 78.0  78.0 - 100.0 fL   MCH 25.2 (*) 26.0 - 34.0 pg   MCHC 32.3  30.0 - 36.0 g/dL   RDW 40.9 (*) 81.1 - 91.4 %   Platelets 251  150 - 400 K/uL   Neutrophils Relative 80 (*) 43 - 77 %   Neutro Abs 8.1 (*) 1.7 - 7.7 K/uL   Lymphocytes Relative 13  12 - 46 %   Lymphs Abs 1.3  0.7 - 4.0 K/uL   Monocytes Relative 7  3 - 12 %   Monocytes Absolute 0.7  0.1 - 1.0 K/uL   Eosinophils Relative 0  0 - 5 %   Eosinophils Absolute  0.0  0.0 - 0.7 K/uL   Basophils Relative 0  0 - 1 %   Basophils Absolute 0.0  0.0 - 0.1 K/uL  LIPASE, BLOOD     Status: Abnormal   Collection Time   07/24/12 10:45 PM      Component Value Range   Lipase 10 (*) 11 - 59 U/L  URINALYSIS, ROUTINE W REFLEX MICROSCOPIC     Status: Abnormal   Collection Time   07/24/12 11:05 PM      Component Value Range   Color, Urine YELLOW  YELLOW   APPearance CLEAR  CLEAR   Specific Gravity, Urine 1.019  1.005 - 1.030   pH 5.5  5.0 - 8.0   Glucose, UA NEGATIVE  NEGATIVE mg/dL   Hgb urine dipstick NEGATIVE  NEGATIVE   Bilirubin Urine NEGATIVE  NEGATIVE   Ketones, ur 15 (*) NEGATIVE mg/dL   Protein, ur NEGATIVE  NEGATIVE mg/dL   Urobilinogen, UA 0.2  0.0 - 1.0 mg/dL   Nitrite NEGATIVE  NEGATIVE   Leukocytes, UA NEGATIVE  NEGATIVE   No results found for this or any previous visit (from the past 240 hour(s)). Creatinine:  Basename 07/24/12 2245 07/23/12 0704 07/23/12 0554  CREATININE 1.90* 1.40* 1.51*    Impression/Assessment:  Right ureteral stone Right hydronephrosis Acute renal failure Right flank pain  Plan:  I discussed the CT findings with the patient and his wife. Clinically it does not appear he has passed the stone but it is possible. We discussed the nature risk benefits and alternatives to cystoscopy with right ureteral stent placement possible right ureteroscopy holmium laser lithotripsy and stone extraction. All questions answered. We discussed risks such as infection and ureteral injury among others. We discussed stent pain. We discussed alternatives such as continued supportive care and stone passage or repeat imaging. He elects to proceed to the OR for endoscopic management. We discussed we may leave a stent and perform a staged procedure. We discussed the likelihood of the patient achieving the goals of the procedure, and any potential problems that might occur during the procedure or recuperation.  Will admit for post-op observation.     Antony Haste 07/25/2012, 12:53 AM

## 2012-07-25 NOTE — Preoperative (Signed)
Beta Blockers   Reason not to administer Beta Blockers:Not Applicable 

## 2012-07-25 NOTE — Anesthesia Procedure Notes (Signed)
Procedure Name: Intubation Date/Time: 07/25/2012 1:49 AM Performed by: Edison Pace Pre-anesthesia Checklist: Patient identified, Timeout performed, Emergency Drugs available, Suction available and Patient being monitored Patient Re-evaluated:Patient Re-evaluated prior to inductionOxygen Delivery Method: Circle system utilized Preoxygenation: Pre-oxygenation with 100% oxygen Intubation Type: Rapid sequence, IV induction and Cricoid Pressure applied Laryngoscope Size: Mac and 4 Grade View: Grade I Tube type: Oral Tube size: 7.5 mm Number of attempts: 1 Airway Equipment and Method: Stylet Placement Confirmation: ETT inserted through vocal cords under direct vision,  positive ETCO2 and breath sounds checked- equal and bilateral Secured at: 21 cm Tube secured with: Tape Dental Injury: Teeth and Oropharynx as per pre-operative assessment

## 2012-07-25 NOTE — Transfer of Care (Signed)
Immediate Anesthesia Transfer of Care Note  Patient: Bradley Gilmore  Procedure(s) Performed: Procedure(s) (LRB): CYSTOSCOPY WITH STENT PLACEMENT (Right) CYSTOSCOPY/RETROGRADE/URETEROSCOPY (Right)  Patient Location: PACU  Anesthesia Type: General  Level of Consciousness: awake, alert , oriented and patient cooperative  Airway & Oxygen Therapy: Patient Spontanous Breathing and Patient connected to face mask oxygen  Post-op Assessment: Report given to PACU RN, Post -op Vital signs reviewed and stable and Patient moving all extremities  Post vital signs: Reviewed and stable  Complications: No apparent anesthesia complications

## 2012-07-26 ENCOUNTER — Encounter (HOSPITAL_COMMUNITY): Payer: Self-pay | Admitting: Urology

## 2012-07-27 NOTE — ED Provider Notes (Signed)
History     CSN: [id number]  Arrival date & time 07/23/12  0455   First MD Initiated Contact with Patient 07/23/12 0515      Chief Complaint  Patient presents with  . Abdominal Pain    (Consider location/radiation/quality/duration/timing/severity/associated sxs/prior treatment) HPI HX per PT. R flank pain sever e and sharp in quality, not radiating, no hematuria, N/V with severe pain, no F/C. No h/o same.  Past Medical History  Diagnosis Date  . Diverticulitis   . Diabetes mellitus     Past Surgical History  Procedure Date  . Appendectomy   . Total knee arthroplasty 2011    L  . Cystoscopy/retrograde/ureteroscopy 07/25/2012    Procedure: CYSTOSCOPY/RETROGRADE/URETEROSCOPY;  Surgeon: Antony Haste, MD;  Location: [facility];  Service: Urology;  Laterality: Right;    No family history on file.  History  Substance Use Topics  . Smoking status: Never Smoker   . Smokeless tobacco: Not on file  . Alcohol Use: No      Review of Systems  Constitutional: Negative for fever and chills.  HENT: Negative for neck pain and neck stiffness.   Eyes: Negative for pain.  Respiratory: Negative for shortness of breath.   Cardiovascular: Negative for chest pain.  Gastrointestinal: Negative for abdominal pain.  Genitourinary: Positive for flank pain. Negative for dysuria.  Musculoskeletal: Negative for back pain.  Skin: Negative for rash.  Neurological: Negative for headaches.  All other systems reviewed and are negative.    Allergies  Review of patient's allergies indicates no known allergies.  Home Medications   Current Outpatient Rx  Name Route Sig Dispense Refill  . GLIMEPIRIDE 4 MG PO TABS Oral Take 4 mg by mouth daily before breakfast.    . METFORMIN HCL 1000 MG PO TABS Oral Take 1,000 mg by mouth 2 (two) times daily with a meal.    . OMEPRAZOLE 20 MG PO CPDR Oral Take 20 mg by mouth daily.    Marland Kitchen SIMVASTATIN 20 MG PO TABS Oral Take 20 mg by mouth daily.    .  ASPIRIN 81 MG PO CHEW Oral Chew 81 mg by mouth daily.    Marland Kitchen CIPROFLOXACIN HCL 250 MG PO TABS Oral Take 1 tablet (250 mg total) by mouth 2 (two) times daily. 10 tablet 0  . MELATONIN 3 MG PO CAPS Oral Take 1 capsule by mouth at bedtime as needed. Sleep.    Marland Kitchen ONDANSETRON HCL 4 MG PO TABS Oral Take 4 mg by mouth every 6 (six) hours.    . OXYCODONE-ACETAMINOPHEN 5-325 MG PO TABS Oral Take 1 tablet by mouth every 4 (four) hours as needed. Pain.    Marland Kitchen PIOGLITAZONE HCL 30 MG PO TABS Oral Take 30 mg by mouth daily.    Marland Kitchen TAMSULOSIN HCL 0.4 MG PO CAPS Oral Take by mouth.    . TAMSULOSIN HCL 0.4 MG PO CAPS Oral Take 1 capsule (0.4 mg total) by mouth daily after supper. 30 capsule 0    BP 144/62  Pulse 71  Temp 98.7 F (37.1 C) (Oral)  Resp 14  SpO2 96%  Physical Exam  Constitutional: He is oriented to person, place, and time. He appears well-developed and well-nourished.  HENT:  Head: Normocephalic and atraumatic.  Eyes: Conjunctivae and EOM are normal. Pupils are equal, round, and reactive to light.  Neck: Trachea normal. Neck supple. No thyromegaly present.  Cardiovascular: Normal rate, regular rhythm, S1 normal, S2 normal and normal pulses.     No systolic murmur  is present   No diastolic murmur is present  Pulses:      Radial pulses are 2+ on the right side, and 2+ on the left side.  Pulmonary/Chest: Effort normal and breath sounds normal. He has no wheezes. He has no rhonchi. He has no rales. He exhibits no tenderness.  Abdominal: Soft. Normal appearance and bowel sounds are normal. There is no tenderness. There is no CVA tenderness and negative Murphy's sign.       Tender R flank without peritonitis or tenderness otherwise  Musculoskeletal:       BLE:s Calves nontender, no cords or erythema, negative Homans sign  Neurological: He is alert and oriented to person, place, and time. He has normal strength. No cranial nerve deficit or sensory deficit. GCS eye subscore is 4. GCS verbal subscore  is 5. GCS motor subscore is 6.  Skin: Skin is warm and dry. No rash noted. He is not diaphoretic.  Psychiatric: His speech is normal.       Cooperative and appropriate    ED Course  Procedures (including critical care time)  Results for orders placed during the hospital encounter of 07/23/12  CBC      Component Value Range   WBC 8.3  4.0 - 10.5 K/uL   RBC 4.41  4.22 - 5.81 MIL/uL   Hemoglobin 11.0 (*) 13.0 - 17.0 g/dL   HCT 16.1 (*) 09.6 - 04.5 %   MCV 78.9  78.0 - 100.0 fL   MCH 24.9 (*) 26.0 - 34.0 pg   MCHC 31.6  30.0 - 36.0 g/dL   RDW 40.9 (*) 81.1 - 91.4 %   Platelets 239  150 - 400 K/uL  COMPREHENSIVE METABOLIC PANEL      Component Value Range   Sodium 139  135 - 145 mEq/L   Potassium 4.4  3.5 - 5.1 mEq/L   Chloride 105  96 - 112 mEq/L   CO2 23  19 - 32 mEq/L   Glucose, Bld 251 (*) 70 - 99 mg/dL   BUN 26 (*) 6 - 23 mg/dL   Creatinine, Ser 7.82 (*) 0.50 - 1.35 mg/dL   Calcium 9.2  8.4 - 95.6 mg/dL   Total Protein 7.5  6.0 - 8.3 g/dL   Albumin 3.8  3.5 - 5.2 g/dL   AST 19  0 - 37 U/L   ALT 12  0 - 53 U/L   Alkaline Phosphatase 71  39 - 117 U/L   Total Bilirubin 0.3  0.3 - 1.2 mg/dL   GFR calc non Af Amer 47 (*) >90 mL/min   GFR calc Af Amer 54 (*) >90 mL/min  URINALYSIS, ROUTINE W REFLEX MICROSCOPIC      Component Value Range   Color, Urine YELLOW  YELLOW   APPearance CLEAR  CLEAR   Specific Gravity, Urine 1.025  1.005 - 1.030   pH 6.0  5.0 - 8.0   Glucose, UA >1000 (*) NEGATIVE mg/dL   Hgb urine dipstick TRACE (*) NEGATIVE   Bilirubin Urine NEGATIVE  NEGATIVE   Ketones, ur 15 (*) NEGATIVE mg/dL   Protein, ur NEGATIVE  NEGATIVE mg/dL   Urobilinogen, UA 0.2  0.0 - 1.0 mg/dL   Nitrite NEGATIVE  NEGATIVE   Leukocytes, UA NEGATIVE  NEGATIVE  URINE MICROSCOPIC-ADD ON      Component Value Range   WBC, UA 0-2  <3 WBC/hpf   RBC / HPF 0-2  <3 RBC/hpf  POCT I-STAT, CHEM 8  Component Value Range   Sodium 142  135 - 145 mEq/L   Potassium 4.3  3.5 - 5.1 mEq/L    Chloride 107  96 - 112 mEq/L   BUN 26 (*) 6 - 23 mg/dL   Creatinine, Ser 1.61 (*) 0.50 - 1.35 mg/dL   Glucose, Bld 096 (*) 70 - 99 mg/dL   Calcium, Ion 0.45  4.09 - 1.30 mmol/L   TCO2 21  0 - 100 mmol/L   Hemoglobin 11.9 (*) 13.0 - 17.0 g/dL   HCT 81.1 (*) 91.4 - 78.2 %   Ct Abdomen Pelvis Wo Contrast  07/23/2012  *RADIOLOGY REPORT*  Clinical Data: Acute onset right flank pain.  CT ABDOMEN AND PELVIS WITHOUT CONTRAST  Technique:  Multidetector CT imaging of the abdomen and pelvis was performed following the standard protocol without intravenous contrast.  Comparison: None.  Findings: Atelectasis in the lung bases.  2 mm stone in the distal right ureter just above the ureterovesicle junction with mild proximal pyelocaliectasis and ureterectasis. Pararenal and periureteral stranding.  Changes are consistent with moderate obstruction.  There is also a punctate nonobstructing stone in the upper pole of the right kidney.  No stones or obstruction demonstrated on the left.  No bladder stone or bladder wall thickening.  The unenhanced appearance of the liver, spleen, gallbladder, pancreas, adrenal glands, abdominal aorta, and retroperitoneal lymph nodes is unremarkable.  The stomach, small bowel, and colon are not abnormally distended.  Mild prominence of visceral adipose tissues.  No free air or free fluid in the abdomen.  Fatty atrophy of the right rectus abdominous muscle.  Pelvis:  The bladder wall is not thickened.  Prostate gland is mildly enlarged, measuring 4.1 x 4.7 cm.  Fat in the inguinal canals bilaterally.  Multiple calcifications within the visualized penis may represent dystrophic calcification or stones.  No free or loculated pelvic fluid collections.  No evidence of diverticulitis. The appendix is not visualized.  Mild degenerative changes in the lumbar spine.  IMPRESSION: 2 mm stone in the distal right ureter with moderate proximal obstruction.  Original Report Authenticated By: Marlon Pel, M.D.   IV dilaudid. IVfs.    1. Kidney stone on right side    Recheck after medications much improved, requesting one more shot of pain medication and then feels comfortable for discharge home. Urology referral provided. Return precautions verbalized as understood.    MDM   Nursing notes reviewed. VS reviewed. Labs and imaging as above. IV narcotics pain control.         Sunnie Nielsen, MD 07/27/12 [phone]

## 2012-11-07 ENCOUNTER — Other Ambulatory Visit: Payer: Self-pay | Admitting: Orthopedic Surgery

## 2012-11-07 DIAGNOSIS — S8990XA Unspecified injury of unspecified lower leg, initial encounter: Secondary | ICD-10-CM

## 2012-11-07 DIAGNOSIS — M25569 Pain in unspecified knee: Secondary | ICD-10-CM

## 2012-11-09 ENCOUNTER — Ambulatory Visit
Admission: RE | Admit: 2012-11-09 | Discharge: 2012-11-09 | Disposition: A | Discharge status: Home / Self Care | Payer: 59 | Source: Ambulatory Visit | Attending: Orthopedic Surgery | Admitting: Orthopedic Surgery

## 2012-11-09 DIAGNOSIS — M25569 Pain in unspecified knee: Secondary | ICD-10-CM

## 2012-11-09 DIAGNOSIS — S8990XA Unspecified injury of unspecified lower leg, initial encounter: Secondary | ICD-10-CM

## 2012-11-10 ENCOUNTER — Other Ambulatory Visit: Payer: Medicare Other

## 2013-10-01 ENCOUNTER — Encounter (INDEPENDENT_AMBULATORY_CARE_PROVIDER_SITE_OTHER): Payer: Self-pay | Admitting: Ophthalmology

## 2013-10-09 ENCOUNTER — Encounter (INDEPENDENT_AMBULATORY_CARE_PROVIDER_SITE_OTHER): Payer: 59 | Admitting: Ophthalmology

## 2013-10-09 DIAGNOSIS — H3530 Unspecified macular degeneration: Secondary | ICD-10-CM

## 2013-10-09 DIAGNOSIS — H43819 Vitreous degeneration, unspecified eye: Secondary | ICD-10-CM

## 2013-10-09 DIAGNOSIS — E1139 Type 2 diabetes mellitus with other diabetic ophthalmic complication: Secondary | ICD-10-CM

## 2013-10-09 DIAGNOSIS — S0510XA Contusion of eyeball and orbital tissues, unspecified eye, initial encounter: Secondary | ICD-10-CM

## 2013-10-09 DIAGNOSIS — H35379 Puckering of macula, unspecified eye: Secondary | ICD-10-CM

## 2013-10-09 DIAGNOSIS — E11319 Type 2 diabetes mellitus with unspecified diabetic retinopathy without macular edema: Secondary | ICD-10-CM

## 2013-11-10 ENCOUNTER — Encounter (INDEPENDENT_AMBULATORY_CARE_PROVIDER_SITE_OTHER): Payer: Non-veteran care | Admitting: Ophthalmology

## 2013-11-10 DIAGNOSIS — E1139 Type 2 diabetes mellitus with other diabetic ophthalmic complication: Secondary | ICD-10-CM

## 2013-11-10 DIAGNOSIS — E11319 Type 2 diabetes mellitus with unspecified diabetic retinopathy without macular edema: Secondary | ICD-10-CM

## 2013-11-10 DIAGNOSIS — H3530 Unspecified macular degeneration: Secondary | ICD-10-CM

## 2013-11-10 DIAGNOSIS — H35379 Puckering of macula, unspecified eye: Secondary | ICD-10-CM

## 2013-11-10 DIAGNOSIS — H43819 Vitreous degeneration, unspecified eye: Secondary | ICD-10-CM

## 2014-04-10 ENCOUNTER — Encounter (INDEPENDENT_AMBULATORY_CARE_PROVIDER_SITE_OTHER): Payer: Non-veteran care | Admitting: Ophthalmology

## 2014-04-16 ENCOUNTER — Encounter (INDEPENDENT_AMBULATORY_CARE_PROVIDER_SITE_OTHER): Payer: 59 | Admitting: Ophthalmology

## 2014-04-16 DIAGNOSIS — H3530 Unspecified macular degeneration: Secondary | ICD-10-CM

## 2014-04-16 DIAGNOSIS — H35039 Hypertensive retinopathy, unspecified eye: Secondary | ICD-10-CM

## 2014-04-16 DIAGNOSIS — E1165 Type 2 diabetes mellitus with hyperglycemia: Secondary | ICD-10-CM

## 2014-04-16 DIAGNOSIS — E11319 Type 2 diabetes mellitus with unspecified diabetic retinopathy without macular edema: Secondary | ICD-10-CM

## 2014-04-16 DIAGNOSIS — I10 Essential (primary) hypertension: Secondary | ICD-10-CM

## 2014-04-16 DIAGNOSIS — E1139 Type 2 diabetes mellitus with other diabetic ophthalmic complication: Secondary | ICD-10-CM

## 2014-04-16 DIAGNOSIS — H43819 Vitreous degeneration, unspecified eye: Secondary | ICD-10-CM

## 2014-04-17 ENCOUNTER — Encounter (INDEPENDENT_AMBULATORY_CARE_PROVIDER_SITE_OTHER): Payer: Non-veteran care | Admitting: Ophthalmology

## 2014-10-07 ENCOUNTER — Encounter: Payer: Self-pay | Admitting: Podiatrist

## 2014-10-07 ENCOUNTER — Ambulatory Visit (INDEPENDENT_AMBULATORY_CARE_PROVIDER_SITE_OTHER): Payer: 59 | Admitting: Podiatrist

## 2014-10-07 ENCOUNTER — Ambulatory Visit (INDEPENDENT_AMBULATORY_CARE_PROVIDER_SITE_OTHER): Payer: 59

## 2014-10-07 VITALS — BP 138/88 | HR 82 | Resp 12

## 2014-10-07 DIAGNOSIS — L600 Ingrowing nail: Secondary | ICD-10-CM | POA: Diagnosis not present

## 2014-10-07 DIAGNOSIS — R52 Pain, unspecified: Secondary | ICD-10-CM | POA: Diagnosis not present

## 2014-10-07 MED ORDER — AMOXICILLIN-POT CLAVULANATE 875-125 MG PO TABS: 1.0000 | ORAL_TABLET | Freq: Two times a day (BID) | ORAL | Status: DC

## 2014-10-07 NOTE — Progress Notes (Signed)
   Subjective:    Patient ID: Bradley Gilmore, male    DOB: [birth year], 68 y.o.   MRN: [id number]  HPI  PT STATED BALL AND BACK OF THE TOES HAVING NUMBNESS FEELING FOR 1-2 YEAR. THE FOOT IS BEEN THE SAME AND FEEL LIKE SOCKS BOLDING IN THE SHOES. TRIED TO SOAK WARM WATER BUT NO HELP  ALSO,B/L GREAT TOENAIL ARE CURVING IN AND THEY ARE SORE.  Review of Systems  HENT: Positive for hearing loss.   All other systems reviewed and are negative.      Objective:   Physical Exam GENERAL APPEARANCE: Alert, conversant. Appropriately groomed. No acute distress.  VASCULAR: Pedal pulses palpable at 2/4 DP and PT bilateral.  Capillary refill time is immediate to all digits,  Proximal to distal cooling it warm to warm.  Digital hair growth is present bilateral  NEUROLOGIC: neuroma type symptomatology is present second interspace right foot.  Palpable click is present.  Numbness in a small area is noted between the toes.  Otherwise, sensation is intact epicritically and protectively to 5.07 monofilament at 5/5 sites bilateral.  Light touch is intact bilateral, vibratory sensation intact bilateral, achilles tendon reflex is intact bilateral.  MUSCULOSKELETAL: acceptable muscle strength, tone and stability bilateral.  Intrinsic muscluature intact bilateral.  Rectus appearance of foot and digits noted bilateral.   DERMATOLOGIC: bilateral hallux nails have incurvated medial and lateral borders.  They are not infected but are uncomfortable.  No redness, swelling, drainage or signs of infection present.  No preulcerative lesions are seen, no interdigital maceration noted.       Assessment & Plan:  Neuroma second interaspace right foot., ingrown right great toenail.  Plan: recommended a steroid injection to decrease pain and inflammation at the second interapace.  Also recommended permanent phenol matrixectomy and patient agreed.  Right great toe was prepped with alcohol and a 1 to 1 mix of 0.5% marcaine plain and  2% lidocaine plain was administered in a digital block fashion.  The toe was then prepped with betadine solution and exsanguinated.  The offending nail border was then excised and matrix tissue exposed.  Phenol was then applied to the matrix tissue followed by an alcohol wash.  Antibiotic ointment and a dry sterile dressing was applied.  The patient was dispensed instructions for aftercare.  I will see him back in 2 weeks for follow up.  If he would like to have a smilar procedure performed on the left hallux, we can perform it at that appointment.

## 2014-10-07 NOTE — Patient Instructions (Signed)

## 2014-10-21 ENCOUNTER — Ambulatory Visit: Payer: Non-veteran care | Admitting: Podiatrist

## 2014-10-23 ENCOUNTER — Ambulatory Visit (INDEPENDENT_AMBULATORY_CARE_PROVIDER_SITE_OTHER): Payer: Non-veteran care | Admitting: Podiatrist

## 2014-10-23 ENCOUNTER — Encounter: Payer: Self-pay | Admitting: Podiatrist

## 2014-10-23 ENCOUNTER — Telehealth: Payer: Self-pay | Admitting: *Deleted

## 2014-10-23 VITALS — BP 160/95 | HR 78 | Temp 98.6°F | Resp 15

## 2014-10-23 DIAGNOSIS — L600 Ingrowing nail: Secondary | ICD-10-CM

## 2014-10-23 MED ORDER — CEPHALEXIN 500 MG PO CAPS: 500.0000 mg | ORAL_CAPSULE | Freq: Three times a day (TID) | ORAL | Status: DC

## 2014-10-23 NOTE — Patient Instructions (Signed)
Soak Instructions    THE DAY AFTER THE PROCEDURE  Place 1/4 cup of epsom salts in a quart of warm tap water.  Submerge your foot or feet with outer bandage intact for the initial soak; this will allow the bandage to become moist and wet for easy lift off.  Once you remove your bandage, continue to soak in the solution for 20 minutes.  This soak should be done twice a day.  Next, remove your foot or feet from solution, blot dry the affected area and cover.  You may use a band aid large enough to cover the area or use gauze and tape.  Apply other medications to the area as directed by the doctor such as polysporin neosporin.  IF YOUR SKIN BECOMES IRRITATED WHILE USING THESE INSTRUCTIONS, IT IS OKAY TO SWITCH TO  WHITE VINEGAR AND WATER. Or you may use antibacterial soap and water to keep the toe clean    

## 2014-10-23 NOTE — Telephone Encounter (Signed)
"  I'd like someone to change the pharmacy in my chart.  It's down for [facility] need you to change it to [address]."  I changed the pharmacy.  "They're going to transfer my prescription from [facility] to [facility], just need you to change it in your system.  Thank you."

## 2014-10-27 NOTE — Progress Notes (Signed)
Subjective: Patient presents today for follow-up of ingrown toenail procedure right great toenail. He said he started to look red and he began taking the antibiotics. He is here for nail check. He also states that the shot I gave him further neuroma second interspace right foot didn't help much.  Objective: Neurovascular status is unchanged with palpable pedal pulses and neurological sensation intact. Right great toenail has slight redness on the medial and lateral nail borders. No active drainage is noted. No pus or purulence is expressed.  Assessment: Right great toenail status post permanent phenol matrixectomy  with localized infection Plan:prescription for Keflex antibiotics to be taken 3 times a day. Also wrote instructions for Epsom salts soaks. I'll see him back for recheck of the right hallux nail at that time if is healing well we will fix the left hallux nail. We'll also revisit the neuroma and may try another injection at the next visit.

## 2014-10-30 ENCOUNTER — Ambulatory Visit (INDEPENDENT_AMBULATORY_CARE_PROVIDER_SITE_OTHER): Payer: 59 | Admitting: Podiatrist

## 2014-10-30 ENCOUNTER — Encounter: Payer: Self-pay | Admitting: Podiatrist

## 2014-10-30 VITALS — BP 162/82 | HR 82 | Resp 16

## 2014-10-30 DIAGNOSIS — G5761 Lesion of plantar nerve, right lower limb: Secondary | ICD-10-CM

## 2014-10-30 DIAGNOSIS — G5781 Other specified mononeuropathies of right lower limb: Secondary | ICD-10-CM

## 2014-10-30 DIAGNOSIS — L600 Ingrowing nail: Secondary | ICD-10-CM

## 2014-10-30 NOTE — Progress Notes (Signed)
Subjective: Patient presents today for follow-up of ingrown toenail procedure right great toenail.  He is here for nail check. He also states that the shot I gave him further neuroma second interspace right foot was somewhat helpful.   Objective: Neurovascular status is unchanged with palpable pedal pulses and neurological sensation intact. Right great toenail has significant improvement in the redness on the medial and lateral nail borders. No active drainage is noted. No pus or purulence is expressed. Pain 2nd interspace right foot consistent with neuroma symptomatology  Assessment: Right great toenail status post permanent phenol matrixectomy, neuroma right   Plan: right great toe looks great.  Recommended an injection for the neuroma and this was carried out today under sterile technique. He tolerated the procedure well. He will let me know when he would like to fix the left great toe ingrown toenail. Otherwise he'll be seen back as needed for follow-up.

## 2015-01-13 ENCOUNTER — Encounter (HOSPITAL_COMMUNITY): Payer: Self-pay | Admitting: Urology

## 2015-01-20 ENCOUNTER — Ambulatory Visit (INDEPENDENT_AMBULATORY_CARE_PROVIDER_SITE_OTHER): Payer: 59 | Admitting: Ophthalmology

## 2015-01-20 DIAGNOSIS — H43813 Vitreous degeneration, bilateral: Secondary | ICD-10-CM

## 2015-01-20 DIAGNOSIS — H3531 Nonexudative age-related macular degeneration: Secondary | ICD-10-CM

## 2015-01-20 DIAGNOSIS — E11329 Type 2 diabetes mellitus with mild nonproliferative diabetic retinopathy without macular edema: Secondary | ICD-10-CM

## 2015-01-20 DIAGNOSIS — E11319 Type 2 diabetes mellitus with unspecified diabetic retinopathy without macular edema: Secondary | ICD-10-CM

## 2015-03-26 ENCOUNTER — Ambulatory Visit: Payer: 59 | Admitting: Podiatrist

## 2015-04-01 ENCOUNTER — Ambulatory Visit: Payer: 59 | Admitting: Podiatrist

## 2015-04-09 ENCOUNTER — Ambulatory Visit: Payer: 59 | Admitting: Podiatrist

## 2015-04-14 ENCOUNTER — Ambulatory Visit (INDEPENDENT_AMBULATORY_CARE_PROVIDER_SITE_OTHER): Payer: 59 | Admitting: Podiatrist

## 2015-04-14 ENCOUNTER — Encounter: Payer: Self-pay | Admitting: Podiatrist

## 2015-04-14 VITALS — BP 155/78 | HR 74 | Resp 15

## 2015-04-14 DIAGNOSIS — L600 Ingrowing nail: Secondary | ICD-10-CM

## 2015-04-14 NOTE — Patient Instructions (Signed)

## 2015-04-14 NOTE — Progress Notes (Signed)
   Subjective:   Patient presents today for pain on the lateral aspect of the left hallux nail. He would like to know if he can have this area fixed like he did the right great toenail.   Objective: Neurovascular status is unchanged with palpable pedal pulses and neurological sensation intact. Left hallux nail lateral nail border is painful with pressure. No evidence of redness, swelling or infection is noted. Discomfort with direct pressure and shoe gear is noted.  Assessment: Ingrown toenail left great toenail   Plan: Treatment options and alternatives discussed.  Recommended permanent phenol matrixectomy and patient agreed.  left was prepped with alcohol and a 1 to 1 mix of 0.5% marcaine plain and 2% lidocaine plain was administered in a digital block fashion.  The toe was then prepped with betadine solution and exsanguinated.  The offending nail border was then excised and matrix tissue exposed.  Phenol was then applied to the matrix tissue followed by an alcohol wash.  Antibiotic ointment and a dry sterile dressing was applied.  The patient was dispensed instructions for aftercare.

## 2015-10-19 ENCOUNTER — Other Ambulatory Visit: Payer: Self-pay | Admitting: Otolaryngology

## 2015-10-19 DIAGNOSIS — M542 Cervicalgia: Secondary | ICD-10-CM

## 2015-10-19 DIAGNOSIS — H9201 Otalgia, right ear: Secondary | ICD-10-CM

## 2015-10-19 DIAGNOSIS — J029 Acute pharyngitis, unspecified: Secondary | ICD-10-CM

## 2015-10-21 ENCOUNTER — Ambulatory Visit (INDEPENDENT_AMBULATORY_CARE_PROVIDER_SITE_OTHER): Payer: Self-pay | Admitting: Ophthalmology

## 2015-10-22 ENCOUNTER — Ambulatory Visit
Admission: RE | Admit: 2015-10-22 | Discharge: 2015-10-22 | Disposition: A | Discharge status: Home / Self Care | Payer: 59 | Source: Ambulatory Visit | Attending: Otolaryngology | Admitting: Otolaryngology

## 2015-10-22 DIAGNOSIS — M542 Cervicalgia: Secondary | ICD-10-CM

## 2015-10-22 DIAGNOSIS — J029 Acute pharyngitis, unspecified: Secondary | ICD-10-CM

## 2015-10-22 DIAGNOSIS — H9201 Otalgia, right ear: Secondary | ICD-10-CM

## 2015-10-22 MED ORDER — IOPAMIDOL (ISOVUE-300) INJECTION 61%
75.0000 mL | Freq: Once | INTRAVENOUS | Status: AC | PRN
  Administered 2015-10-22: 75 mL via INTRAVENOUS

## 2015-12-27 ENCOUNTER — Ambulatory Visit (INDEPENDENT_AMBULATORY_CARE_PROVIDER_SITE_OTHER): Payer: 59 | Admitting: Ophthalmology

## 2016-01-31 ENCOUNTER — Ambulatory Visit (INDEPENDENT_AMBULATORY_CARE_PROVIDER_SITE_OTHER): Payer: 59 | Admitting: Ophthalmology

## 2016-01-31 DIAGNOSIS — H353121 Nonexudative age-related macular degeneration, left eye, early dry stage: Secondary | ICD-10-CM | POA: Diagnosis not present

## 2016-01-31 DIAGNOSIS — H43813 Vitreous degeneration, bilateral: Secondary | ICD-10-CM

## 2016-01-31 DIAGNOSIS — E113291 Type 2 diabetes mellitus with mild nonproliferative diabetic retinopathy without macular edema, right eye: Secondary | ICD-10-CM | POA: Diagnosis not present

## 2016-01-31 DIAGNOSIS — E11319 Type 2 diabetes mellitus with unspecified diabetic retinopathy without macular edema: Secondary | ICD-10-CM | POA: Diagnosis not present

## 2016-01-31 DIAGNOSIS — H353114 Nonexudative age-related macular degeneration, right eye, advanced atrophic with subfoveal involvement: Secondary | ICD-10-CM | POA: Diagnosis not present

## 2016-02-22 ENCOUNTER — Encounter: Payer: Self-pay | Admitting: Podiatry

## 2016-02-22 ENCOUNTER — Ambulatory Visit (INDEPENDENT_AMBULATORY_CARE_PROVIDER_SITE_OTHER): Payer: 59 | Admitting: Podiatry

## 2016-02-22 VITALS — BP 119/86 | HR 58 | Resp 12

## 2016-02-22 DIAGNOSIS — L603 Nail dystrophy: Secondary | ICD-10-CM

## 2016-02-22 DIAGNOSIS — E11319 Type 2 diabetes mellitus with unspecified diabetic retinopathy without macular edema: Secondary | ICD-10-CM

## 2016-02-22 DIAGNOSIS — M79676 Pain in unspecified toe(s): Secondary | ICD-10-CM | POA: Diagnosis not present

## 2016-02-22 DIAGNOSIS — B351 Tinea unguium: Secondary | ICD-10-CM

## 2016-02-23 NOTE — Progress Notes (Signed)
He presents today stating that Dr. Valentina Lucks.and remove the nail plate to the second digit of the right foot last year. He states that now seems to be growing back and is growing back abnormal and thick. He states that I really don't want to have it removed again because of the pain but I would like to see if there is something else we can do.  Objective: Vital signs stable alert and oriented 3. Pulses are palpable. His toenails are thick yellow dystrophic onychomycotic and painful on palpation as well as debridement. No open lesions or wounds.  Assessment: Nail dystrophy bilaterally with pain in limb.  Plan: I debrided his nails 1 through 5 bilateral covered service secondary to pain.

## 2016-05-23 ENCOUNTER — Encounter: Payer: Self-pay | Admitting: Podiatry

## 2016-05-23 ENCOUNTER — Ambulatory Visit (INDEPENDENT_AMBULATORY_CARE_PROVIDER_SITE_OTHER): Payer: 59 | Admitting: Podiatry

## 2016-05-23 DIAGNOSIS — B351 Tinea unguium: Secondary | ICD-10-CM | POA: Diagnosis not present

## 2016-05-23 DIAGNOSIS — M79676 Pain in unspecified toe(s): Secondary | ICD-10-CM | POA: Diagnosis not present

## 2016-05-23 DIAGNOSIS — E11319 Type 2 diabetes mellitus with unspecified diabetic retinopathy without macular edema: Secondary | ICD-10-CM

## 2016-05-24 NOTE — Progress Notes (Signed)
He presents today with a chief complaint of painful elongated toenails 1 through 5 bilaterally.  Objective: Toenails are thick yellow dystrophic with mycotic and painful on palpation. No open lesions or wounds. Pulses remain palpable.  Assessment: Pain limb secondary to onychomycosis.  Plan: Debridement of toenails 1 through 5 bilateral.

## 2016-08-17 ENCOUNTER — Ambulatory Visit: Payer: Self-pay | Admitting: Orthopedic Surgery

## 2016-08-18 ENCOUNTER — Encounter (HOSPITAL_COMMUNITY)
Admission: RE | Admit: 2016-08-18 | Discharge: 2016-08-18 | Disposition: A | Discharge status: Home / Self Care | Payer: Non-veteran care | Source: Ambulatory Visit | Attending: Orthopedic Surgery | Admitting: Orthopedic Surgery

## 2016-08-18 ENCOUNTER — Encounter (HOSPITAL_COMMUNITY): Payer: Self-pay

## 2016-08-18 ENCOUNTER — Other Ambulatory Visit: Payer: Self-pay

## 2016-08-18 DIAGNOSIS — Z01812 Encounter for preprocedural laboratory examination: Secondary | ICD-10-CM | POA: Insufficient documentation

## 2016-08-18 LAB — CBC
HEMATOCRIT: 39.6 % (ref 39.0–52.0)
Hemoglobin: 13.2 g/dL (ref 13.0–17.0)
MCH: 29.7 pg (ref 26.0–34.0)
MCHC: 33.3 g/dL (ref 30.0–36.0)
MCV: 89.2 fL (ref 78.0–100.0)
PLATELETS: 235 10*3/uL (ref 150–400)
RBC: 4.44 MIL/uL (ref 4.22–5.81)
RDW: 13.2 % (ref 11.5–15.5)
WBC: 6 10*3/uL (ref 4.0–10.5)

## 2016-08-18 LAB — URINALYSIS, ROUTINE W REFLEX MICROSCOPIC
GLUCOSE, UA: 500 mg/dL — AB
HGB URINE DIPSTICK: NEGATIVE
KETONES UR: NEGATIVE mg/dL
LEUKOCYTES UA: NEGATIVE
Nitrite: NEGATIVE
PROTEIN: NEGATIVE mg/dL
Specific Gravity, Urine: 1.025 (ref 1.005–1.030)
pH: 5 (ref 5.0–8.0)

## 2016-08-18 LAB — COMPREHENSIVE METABOLIC PANEL
ALT: 23 U/L (ref 17–63)
AST: 25 U/L (ref 15–41)
Albumin: 4.2 g/dL (ref 3.5–5.0)
Alkaline Phosphatase: 63 U/L (ref 38–126)
Anion gap: 4 — ABNORMAL LOW (ref 5–15)
BUN: 25 mg/dL — AB (ref 6–20)
CHLORIDE: 107 mmol/L (ref 101–111)
CO2: 27 mmol/L (ref 22–32)
CREATININE: 1.15 mg/dL (ref 0.61–1.24)
Calcium: 9.7 mg/dL (ref 8.9–10.3)
GFR calc Af Amer: >60 mL/min (ref >60)
Glucose, Bld: 200 mg/dL — ABNORMAL HIGH (ref 65–99)
Potassium: 4.9 mmol/L (ref 3.5–5.1)
Sodium: 138 mmol/L (ref 135–145)
Total Bilirubin: 0.6 mg/dL (ref 0.3–1.2)
Total Protein: 7.3 g/dL (ref 6.5–8.1)

## 2016-08-18 LAB — APTT: aPTT: 29 seconds (ref 24–36)

## 2016-08-18 LAB — PROTIME-INR
INR: 1.02
Prothrombin Time: 13.4 seconds (ref 11.4–15.2)

## 2016-08-18 LAB — SURGICAL PCR SCREEN
MRSA, PCR: NEGATIVE
Staphylococcus aureus: NEGATIVE

## 2016-08-18 NOTE — Patient Instructions (Signed)
HURLEY BAZE  08/18/2016   Your procedure is scheduled on: 08/28/16  Report to [facility] Main  Entrance take [facility]  elevators to 3rd floor to [place] [facility] [place]  Call this number if you have problems the morning of surgery [phone]   Remember: ONLY 1 PERSON MAY GO WITH YOU TO SHORT STAY TO GET  READY MORNING OF YOUR SURGERY.  Do not eat food or drink liquids :After Midnight Sunday     Take these medicines the morning of surgery with A SIP OF WATER: Prilosec                 DO NOT TAKE ANY DIABETIC MEDICATIONS DAY OF YOUR SURGERY                               You may not have any metal on your body.             Do not wear jewelry, lotions, powders  deodorant                          Men may shave face and neck.   Do not bring valuables to the hospital. [place].  Contacts, dentures or bridgework may not be worn into surgery.  Leave suitcase in the car. After surgery it may be brought to your room. _____________________________________________________________________             [facility] - Preparing for Surgery  Before surgery, you can play an important role.  Because skin is not sterile, your skin needs to be as free of germs as possible.  You can reduce the number of germs on you skin by washing with CHG (chlorahexidine gluconate) soap before surgery.  CHG is an antiseptic cleaner which kills germs and bonds with the skin to continue killing germs even after washing.  Please DO NOT use if you have an allergy to CHG or antibacterial soaps.  If your skin becomes reddened/irritated stop using the CHG and inform your nurse when you arrive at Short Stay.  Do not shave (including legs and underarms) for at least 48 hours prior to the first CHG shower.  You may shave your face.  Please follow these instructions carefully:   1.  Shower with CHG Soap the night before surgery and the                                 morning of Surgery.  2.  If you choose to wash your hair, wash your hair first as usual with your       normal shampoo.  3.  After you shampoo, rinse your hair and body thoroughly to remove the                      Shampoo.  4.  Use CHG as you would any other liquid soap.  You can apply chg directly       to the skin and wash gently with scrungie or a clean washcloth.  5.  Apply the CHG Soap to your body ONLY FROM THE NECK DOWN.  Do not use on open wounds or open sores.  Avoid contact with your eyes,       ears, mouth and genitals (private parts).  Wash genitals (private parts)       with your normal soap.  6.  Wash thoroughly, paying special attention to the area where your surgery        will be performed.  7.  Thoroughly rinse your body with warm water from the neck down.  8.  DO NOT shower/wash with your normal soap after using and rinsing off       the CHG Soap.  9.  Pat yourself dry with a clean towel.            10.  Wear clean pajamas.            11.  Place clean sheets on your bed the night of your first shower and do not        sleep with pets.  Day of Surgery  Do not apply any lotions/deoderants the morning of surgery.  Please wear clean clothes to the hospital/surgery center.

## 2016-08-22 ENCOUNTER — Encounter: Payer: Self-pay | Admitting: Podiatry

## 2016-08-22 ENCOUNTER — Ambulatory Visit (INDEPENDENT_AMBULATORY_CARE_PROVIDER_SITE_OTHER): Payer: 59 | Admitting: Podiatry

## 2016-08-22 DIAGNOSIS — E11319 Type 2 diabetes mellitus with unspecified diabetic retinopathy without macular edema: Secondary | ICD-10-CM | POA: Diagnosis not present

## 2016-08-22 DIAGNOSIS — M79676 Pain in unspecified toe(s): Secondary | ICD-10-CM

## 2016-08-22 DIAGNOSIS — Q828 Other specified congenital malformations of skin: Secondary | ICD-10-CM

## 2016-08-22 DIAGNOSIS — B351 Tinea unguium: Secondary | ICD-10-CM

## 2016-08-23 NOTE — Progress Notes (Signed)
He presents today with chief complaint of painful elongated toenails and painful calluses bilateral.  Objective: Vital signs are stable alert and oriented 3. Pulses are strongly palpable. Neurologic sensorium is intact. Deep tendon reflexes are intact. Toenails are thick yellow dystrophic with mycotic painful palpation sharp innervated nail margins. Also demonstrates reactive porokeratotic lesions plantar aspect of the bilateral foot and toes.  Assessment: Pain limb secondary to onychomycosis and porokeratosis.  Plan: Debridement of toenails and 4 keratomas bilaterally. Follow-up with him in 3 months

## 2016-08-27 ENCOUNTER — Ambulatory Visit: Payer: Self-pay | Admitting: Orthopedic Surgery

## 2016-08-27 NOTE — H&P (Signed)
Eaton Dicristina Gaus DOB: Aug 19, 1946 Married / Language: English / Race: White Male Date of Admission:  08/28/2016 CC:  Right Knee Pain History of Present Illness The patient is a 69 year old male who comes in for a preoperative History and Physical. The patient is scheduled for a right total knee arthroplasty to be performed by Dr. Dione Plover. Aluisio, MD at [facility] on 08/28/2016. The patient was seen in consultation for Dr. Erskine Emery. The patient reports right knee symptoms including: pain (medial side pain) and popping which began 4 year(s) ago without any known injury (pain started after the right knee scope). The patient describes their pain as sharp and aching. The patient feels that the symptoms are worsening. The patient has the current diagnosis of knee osteoarthritis. Past treatment for this problem has included intra-articular injection of corticosteroids. The patient does not report any radiation of symptoms. Current treatment includes nonsteroidal anti-inflammatory drugs (Tramadol w/ lidocaine patch). Note for "Knee pain": hx of right knee scope 2012-2013 Mr. Vallez comes in with significant pain and dysfunction in his right knee. This has gotten progressively worse over time. It hurts at all times now. This is limiting what he can and cannot do. He has had cortisone and viscosupplement injection in the past, which initially helped, but recently they have provided less and less benefit. The knee is preventing him from doing things he desires. He previously had a left total knee arthroplasty done by Dr. Alvan Dame about four years ago. 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 He said he still has some stiffness in that knee. Right knee, however, is far worse than the left. He is ready to get teh right knee fixed at this time. They have been treated conservatively in the past for the above stated problem and despite conservative measures, they continue to have  progressive pain and severe functional limitations and dysfunction. They have failed non-operative management including home exercise, medications, and injections. It is felt that they would benefit from undergoing total joint replacement. Risks and benefits of the procedure have been discussed with the patient and they elect to proceed with surgery. There are no active contraindications to surgery such as ongoing infection or rapidly progressive neurological disease.  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  Social History  Children  2 Current work status  retired Furniture conservator/restorer daily; does running / walking Living situation  live with spouse Marital status  married Never consumed alcohol  04/06/2016: Never consumed alcohol No history of drug/alcohol rehab  Not under pain contract  Number of flights of stairs before winded  2-3 Tobacco / smoke exposure  04/06/2016: no Tobacco use  Never smoker. 04/06/2016 Advance Directives  Living Will  Medication History  Vitamin D (Ergocalciferol) (50000UNIT Capsule, Oral) Active. Etodolac (400MG  Tablet, Oral) Active. Simvastatin (20MG  Tablet, Oral) Active. PriLOSEC (10MG  Capsule DR, Oral) Active. Glimepiride (4MG  Tablet, Oral) Active. MetFORMIN HCl ER (500MG  Tablet ER 24HR, Oral) Active.  Past Surgical History  Appendectomy  Cataract Surgery  bilateral Total Knee Replacement  left Vasectomy    Review of Systems  General Not Present- Chills, Fatigue,  Fever, Memory Loss, Night Sweats, Weight Gain and Weight  Loss. Skin Not Present- Eczema, Hives, Itching, Lesions and Rash. HEENT Present- Hearing Loss. Not Present- Dentures, Double Vision, Headache, Tinnitus and Visual Loss. Respiratory Not Present- Allergies, Chronic Cough, Coughing up blood, Shortness of breath at rest and Shortness of breath with exertion. Cardiovascular Not Present- Chest Pain, Difficulty Breathing Lying Down, Murmur, Palpitations, Racing/skipping heartbeats and Swelling. Gastrointestinal Not Present- Abdominal Pain, Bloody Stool, Constipation, Diarrhea, Difficulty Swallowing, Heartburn, Jaundice, Loss of appetitie, Nausea and Vomiting. Male Genitourinary Not Present- Blood in Urine, Discharge, Flank Pain, Incontinence, Painful Urination, Urgency, Urinary frequency, Urinary Retention, Urinating at Night and Weak urinary stream. Musculoskeletal Present- Back Pain, Joint Pain and Joint Swelling. Not Present- Morning Stiffness, Muscle Pain, Muscle Weakness and Spasms. Neurological Not Present- Blackout spells, Difficulty with balance, Dizziness, Paralysis, Tremor and Weakness. Psychiatric Not Present- Insomnia.  Vitals  Weight: 203 lb Height: 71in Body Surface Area: 2.12 m Body Mass Index: 28.31 kg/m  Pulse: 84 (Regular)  BP: 132/76 (Sitting, Right Arm, Standard)  Physical Exam General Mental Status -Alert, cooperative and good historian. General Appearance-pleasant, Not in acute distress. Orientation-Oriented X3. Build & Nutrition-Well nourished and Well developed.  Head and Neck Head-normocephalic, atraumatic . Neck Global Assessment - supple, no bruit auscultated on the right, no bruit auscultated on the left.  Eye Pupil - Bilateral-Regular and Round. Motion - Bilateral-EOMI.  Chest and Lung Exam Auscultation Breath sounds - clear at anterior chest wall and clear at posterior chest wall. Adventitious sounds - No Adventitious sounds.  Cardiovascular Auscultation Rhythm - Regular rate and  rhythm. Heart Sounds - S1 WNL and S2 WNL. Murmurs & Other Heart Sounds - Auscultation of the heart reveals - No Murmurs.  Abdomen Palpation/Percussion Tenderness - Abdomen is non-tender to palpation. Rigidity (guarding) - Abdomen is soft. Auscultation Auscultation of the abdomen reveals - Bowel sounds normal.  Male Genitourinary Note: Not done, not pertinent to present illness   Musculoskeletal Note: He is alert and oriented, in no apparent distress. His hips show normal range of motion, no discomfort. His right knee shows no effusion. He has got slight varus deformity. His range of motion is 5 to 130. There is moderate crepitus on range of motion. There is tenderness medial greater than lateral. There is no instability noted. Left knee, no effusion, range about 0 to 110 with no tenderness or instability. Pulse, sensation and motor intact both lower extremities. Significantly antalgic gait pattern on the right.  RADIOGRAPHS AP both knees and lateral show that he has bone on bone arthritis in the medial and patellofemoral compartments of the right knee. His left knee prosthesis appears to be in good position and no periprosthetic abnormalities.   Assessment & Plan Primary osteoarthritis of right knee (M17.11)  Note:Surgical Plans: Right Total Knee Replacement  Disposition: Home  PCP: Dr. Laurann Montana  IV TXA  Anesthesia Issues: None  VERITAS STUDY PATIENT Traditional Therapy  Signed electronically by Ok Edwards, III PA-C

## 2016-08-28 ENCOUNTER — Inpatient Hospital Stay (HOSPITAL_COMMUNITY)
Admission: RE | Admit: 2016-08-28 | Discharge: 2016-08-30 | DRG: 470 | Disposition: A | Discharge status: Home Health | Payer: Non-veteran care | Source: Ambulatory Visit | Attending: Orthopedic Surgery | Admitting: Orthopedic Surgery

## 2016-08-28 ENCOUNTER — Encounter (HOSPITAL_COMMUNITY)
Admission: RE | Disposition: A | Discharge status: Home Health | Payer: Self-pay | Source: Ambulatory Visit | Attending: Orthopedic Surgery

## 2016-08-28 ENCOUNTER — Inpatient Hospital Stay (HOSPITAL_COMMUNITY): Payer: Non-veteran care | Admitting: Anesthesiology

## 2016-08-28 ENCOUNTER — Encounter (HOSPITAL_COMMUNITY): Payer: Self-pay | Admitting: *Deleted

## 2016-08-28 DIAGNOSIS — K219 Gastro-esophageal reflux disease without esophagitis: Secondary | ICD-10-CM | POA: Diagnosis present

## 2016-08-28 DIAGNOSIS — M179 Osteoarthritis of knee, unspecified: Secondary | ICD-10-CM | POA: Diagnosis present

## 2016-08-28 DIAGNOSIS — E119 Type 2 diabetes mellitus without complications: Secondary | ICD-10-CM | POA: Diagnosis present

## 2016-08-28 DIAGNOSIS — Z96652 Presence of left artificial knee joint: Secondary | ICD-10-CM | POA: Diagnosis present

## 2016-08-28 DIAGNOSIS — Z7984 Long term (current) use of oral hypoglycemic drugs: Secondary | ICD-10-CM

## 2016-08-28 DIAGNOSIS — M1711 Unilateral primary osteoarthritis, right knee: Principal | ICD-10-CM | POA: Diagnosis present

## 2016-08-28 DIAGNOSIS — M25561 Pain in right knee: Secondary | ICD-10-CM | POA: Diagnosis present

## 2016-08-28 DIAGNOSIS — M25761 Osteophyte, right knee: Secondary | ICD-10-CM | POA: Diagnosis present

## 2016-08-28 DIAGNOSIS — Z79899 Other long term (current) drug therapy: Secondary | ICD-10-CM | POA: Diagnosis not present

## 2016-08-28 DIAGNOSIS — M1710 Unilateral primary osteoarthritis, unspecified knee: Secondary | ICD-10-CM | POA: Diagnosis present

## 2016-08-28 HISTORY — PX: TOTAL KNEE ARTHROPLASTY: SHX125

## 2016-08-28 LAB — GLUCOSE, CAPILLARY
GLUCOSE-CAPILLARY: 125 mg/dL — AB (ref 65–99)
GLUCOSE-CAPILLARY: 276 mg/dL — AB (ref 65–99)
Glucose-Capillary: 131 mg/dL — ABNORMAL HIGH (ref 65–99)

## 2016-08-28 LAB — TYPE AND SCREEN
ABO/RH(D): A POS
Antibody Screen: NEGATIVE

## 2016-08-28 SURGERY — ARTHROPLASTY, KNEE, TOTAL
Anesthesia: Monitor Anesthesia Care | Site: Knee | Laterality: Right

## 2016-08-28 MED ORDER — ONDANSETRON HCL 4 MG/2ML IJ SOLN: 4.0000 mg | Freq: Four times a day (QID) | INTRAMUSCULAR | Status: DC | PRN

## 2016-08-28 MED ORDER — POLYETHYLENE GLYCOL 3350 17 G PO PACK: 17.0000 g | PACK | Freq: Every day | ORAL | Status: DC | PRN

## 2016-08-28 MED ORDER — BISACODYL 10 MG RE SUPP: 10.0000 mg | Freq: Every day | RECTAL | Status: DC | PRN

## 2016-08-28 MED ORDER — FENTANYL CITRATE (PF) 100 MCG/2ML IJ SOLN
INTRAMUSCULAR | Status: AC
  Filled 2016-08-28: qty 2

## 2016-08-28 MED ORDER — MORPHINE SULFATE (PF) 10 MG/ML IV SOLN: 1.0000 mg | INTRAVENOUS | Status: DC | PRN

## 2016-08-28 MED ORDER — CEFAZOLIN SODIUM-DEXTROSE 2-4 GM/100ML-% IV SOLN
INTRAVENOUS | Status: AC
  Filled 2016-08-28: qty 100

## 2016-08-28 MED ORDER — TRANEXAMIC ACID 1000 MG/10ML IV SOLN
1000.0000 mg | INTRAVENOUS | Status: AC
  Administered 2016-08-28: 1000 mg via INTRAVENOUS
  Filled 2016-08-28: qty 1100

## 2016-08-28 MED ORDER — ACETAMINOPHEN 10 MG/ML IV SOLN
1000.0000 mg | Freq: Once | INTRAVENOUS | Status: AC
  Administered 2016-08-28: 1000 mg via INTRAVENOUS

## 2016-08-28 MED ORDER — HYDROMORPHONE HCL 1 MG/ML IJ SOLN
0.2500 mg | INTRAMUSCULAR | Status: DC | PRN
  Administered 2016-08-28 (×2): 0.5 mg via INTRAVENOUS

## 2016-08-28 MED ORDER — ONDANSETRON HCL 4 MG/2ML IJ SOLN
INTRAMUSCULAR | Status: AC
  Filled 2016-08-28: qty 2

## 2016-08-28 MED ORDER — CHLORHEXIDINE GLUCONATE 4 % EX LIQD: 60.0000 mL | Freq: Once | CUTANEOUS | Status: DC

## 2016-08-28 MED ORDER — MENTHOL 3 MG MT LOZG: 1.0000 | LOZENGE | OROMUCOSAL | Status: DC | PRN

## 2016-08-28 MED ORDER — BUPIVACAINE LIPOSOME 1.3 % IJ SUSP
20.0000 mL | Freq: Once | INTRAMUSCULAR | Status: DC
  Filled 2016-08-28: qty 20

## 2016-08-28 MED ORDER — ACETAMINOPHEN 650 MG RE SUPP: 650.0000 mg | Freq: Four times a day (QID) | RECTAL | Status: DC | PRN

## 2016-08-28 MED ORDER — PHENOL 1.4 % MT LIQD
1.0000 | OROMUCOSAL | Status: DC | PRN
  Filled 2016-08-28: qty 177

## 2016-08-28 MED ORDER — SODIUM CHLORIDE 0.9 % IV SOLN
INTRAVENOUS | Status: DC
  Administered 2016-08-28 – 2016-08-29 (×2): via INTRAVENOUS

## 2016-08-28 MED ORDER — LIDOCAINE 2% (20 MG/ML) 5 ML SYRINGE
INTRAMUSCULAR | Status: AC
  Filled 2016-08-28: qty 5

## 2016-08-28 MED ORDER — MIDAZOLAM HCL 5 MG/5ML IJ SOLN
INTRAMUSCULAR | Status: DC | PRN
  Administered 2016-08-28: 2 mg via INTRAVENOUS

## 2016-08-28 MED ORDER — MIDAZOLAM HCL 2 MG/2ML IJ SOLN
INTRAMUSCULAR | Status: AC
  Filled 2016-08-28: qty 2

## 2016-08-28 MED ORDER — FLEET ENEMA 7-19 GM/118ML RE ENEM: 1.0000 | ENEMA | Freq: Once | RECTAL | Status: DC | PRN

## 2016-08-28 MED ORDER — 0.9 % SODIUM CHLORIDE (POUR BTL) OPTIME
TOPICAL | Status: DC | PRN
  Administered 2016-08-28: 1000 mL

## 2016-08-28 MED ORDER — METHOCARBAMOL 500 MG PO TABS
500.0000 mg | ORAL_TABLET | Freq: Four times a day (QID) | ORAL | Status: DC | PRN
  Administered 2016-08-28 – 2016-08-30 (×7): 500 mg via ORAL
  Filled 2016-08-28 (×7): qty 1

## 2016-08-28 MED ORDER — SODIUM CHLORIDE 0.9 % IJ SOLN
INTRAMUSCULAR | Status: AC
  Filled 2016-08-28: qty 50

## 2016-08-28 MED ORDER — ONDANSETRON HCL 4 MG PO TABS: 4.0000 mg | ORAL_TABLET | Freq: Four times a day (QID) | ORAL | Status: DC | PRN

## 2016-08-28 MED ORDER — CEFAZOLIN SODIUM-DEXTROSE 2-4 GM/100ML-% IV SOLN
2.0000 g | INTRAVENOUS | Status: AC
  Administered 2016-08-28: 2 g via INTRAVENOUS

## 2016-08-28 MED ORDER — LACTATED RINGERS IV SOLN: INTRAVENOUS | Status: DC

## 2016-08-28 MED ORDER — METHOCARBAMOL 1000 MG/10ML IJ SOLN
500.0000 mg | Freq: Four times a day (QID) | INTRAVENOUS | Status: DC | PRN
  Administered 2016-08-28: 500 mg via INTRAVENOUS
  Filled 2016-08-28: qty 5
  Filled 2016-08-28: qty 550

## 2016-08-28 MED ORDER — INSULIN ASPART 100 UNIT/ML ~~LOC~~ SOLN
3.0000 [IU] | Freq: Once | SUBCUTANEOUS | Status: AC
  Administered 2016-08-28: 3 [IU] via SUBCUTANEOUS

## 2016-08-28 MED ORDER — BUPIVACAINE HCL 0.25 % IJ SOLN
INTRAMUSCULAR | Status: DC | PRN
  Administered 2016-08-28: 30 mL

## 2016-08-28 MED ORDER — CEFAZOLIN SODIUM-DEXTROSE 2-4 GM/100ML-% IV SOLN
2.0000 g | Freq: Four times a day (QID) | INTRAVENOUS | Status: AC
  Administered 2016-08-28 (×2): 2 g via INTRAVENOUS
  Filled 2016-08-28 (×2): qty 100

## 2016-08-28 MED ORDER — PANTOPRAZOLE SODIUM 40 MG PO TBEC: 40.0000 mg | DELAYED_RELEASE_TABLET | Freq: Every day | ORAL | Status: DC

## 2016-08-28 MED ORDER — MORPHINE SULFATE (PF) 2 MG/ML IV SOLN
1.0000 mg | INTRAVENOUS | Status: DC | PRN
  Administered 2016-08-28 – 2016-08-29 (×4): 1 mg via INTRAVENOUS
  Filled 2016-08-28 (×4): qty 1

## 2016-08-28 MED ORDER — GLIMEPIRIDE 4 MG PO TABS
4.0000 mg | ORAL_TABLET | Freq: Every day | ORAL | Status: DC
  Administered 2016-08-29 – 2016-08-30 (×2): 4 mg via ORAL
  Filled 2016-08-28 (×2): qty 1

## 2016-08-28 MED ORDER — BUPIVACAINE LIPOSOME 1.3 % IJ SUSP
INTRAMUSCULAR | Status: DC | PRN
  Administered 2016-08-28: 20 mL

## 2016-08-28 MED ORDER — PROPOFOL 500 MG/50ML IV EMUL
INTRAVENOUS | Status: DC | PRN
  Administered 2016-08-28: 75 ug/kg/min via INTRAVENOUS

## 2016-08-28 MED ORDER — METOCLOPRAMIDE HCL 5 MG PO TABS: 5.0000 mg | ORAL_TABLET | Freq: Three times a day (TID) | ORAL | Status: DC | PRN

## 2016-08-28 MED ORDER — BUPIVACAINE HCL (PF) 0.25 % IJ SOLN
INTRAMUSCULAR | Status: AC
  Filled 2016-08-28: qty 30

## 2016-08-28 MED ORDER — FENTANYL CITRATE (PF) 100 MCG/2ML IJ SOLN
INTRAMUSCULAR | Status: DC | PRN
  Administered 2016-08-28: 50 ug via INTRAVENOUS

## 2016-08-28 MED ORDER — DEXAMETHASONE SODIUM PHOSPHATE 10 MG/ML IJ SOLN
INTRAMUSCULAR | Status: AC
  Filled 2016-08-28: qty 1

## 2016-08-28 MED ORDER — DEXAMETHASONE SODIUM PHOSPHATE 10 MG/ML IJ SOLN
10.0000 mg | Freq: Once | INTRAMUSCULAR | Status: AC
  Administered 2016-08-29: 10 mg via INTRAVENOUS
  Filled 2016-08-28: qty 1

## 2016-08-28 MED ORDER — PROPOFOL 10 MG/ML IV BOLUS
INTRAVENOUS | Status: AC
  Filled 2016-08-28: qty 20

## 2016-08-28 MED ORDER — DOCUSATE SODIUM 100 MG PO CAPS
100.0000 mg | ORAL_CAPSULE | Freq: Two times a day (BID) | ORAL | Status: DC
  Administered 2016-08-28 – 2016-08-30 (×4): 100 mg via ORAL
  Filled 2016-08-28 (×4): qty 1

## 2016-08-28 MED ORDER — PROPOFOL 10 MG/ML IV BOLUS
INTRAVENOUS | Status: AC
  Filled 2016-08-28: qty 40

## 2016-08-28 MED ORDER — SIMVASTATIN 20 MG PO TABS
20.0000 mg | ORAL_TABLET | Freq: Every day | ORAL | Status: DC
  Administered 2016-08-29 – 2016-08-30 (×2): 20 mg via ORAL
  Filled 2016-08-28 (×2): qty 1

## 2016-08-28 MED ORDER — METFORMIN HCL 500 MG PO TABS
500.0000 mg | ORAL_TABLET | Freq: Two times a day (BID) | ORAL | Status: DC
  Filled 2016-08-28: qty 1

## 2016-08-28 MED ORDER — LACTATED RINGERS IV SOLN
INTRAVENOUS | Status: DC | PRN
  Administered 2016-08-28 (×3): via INTRAVENOUS

## 2016-08-28 MED ORDER — DEXAMETHASONE SODIUM PHOSPHATE 10 MG/ML IJ SOLN
10.0000 mg | Freq: Once | INTRAMUSCULAR | Status: AC
  Administered 2016-08-28: 10 mg via INTRAVENOUS

## 2016-08-28 MED ORDER — RIVAROXABAN 10 MG PO TABS
10.0000 mg | ORAL_TABLET | Freq: Every day | ORAL | Status: DC
  Administered 2016-08-29 – 2016-08-30 (×2): 10 mg via ORAL
  Filled 2016-08-28 (×2): qty 1

## 2016-08-28 MED ORDER — BUPIVACAINE IN DEXTROSE 0.75-8.25 % IT SOLN
INTRATHECAL | Status: DC | PRN
  Administered 2016-08-28: 15 mg via INTRATHECAL

## 2016-08-28 MED ORDER — TRANEXAMIC ACID 1000 MG/10ML IV SOLN
1000.0000 mg | INTRAVENOUS | Status: AC
  Administered 2016-08-28: 1000 mg via INTRAVENOUS
  Filled 2016-08-28: qty 10

## 2016-08-28 MED ORDER — DIPHENHYDRAMINE HCL 12.5 MG/5ML PO ELIX
12.5000 mg | ORAL_SOLUTION | ORAL | Status: DC | PRN
  Administered 2016-08-30: 12.5 mg via ORAL
  Filled 2016-08-28: qty 5

## 2016-08-28 MED ORDER — OXYCODONE HCL 5 MG PO TABS
5.0000 mg | ORAL_TABLET | ORAL | Status: DC | PRN
  Administered 2016-08-28 – 2016-08-30 (×14): 10 mg via ORAL
  Filled 2016-08-28 (×15): qty 2

## 2016-08-28 MED ORDER — SODIUM CHLORIDE 0.9 % IJ SOLN
INTRAMUSCULAR | Status: DC | PRN
  Administered 2016-08-28: 30 mL

## 2016-08-28 MED ORDER — ONDANSETRON HCL 4 MG/2ML IJ SOLN
INTRAMUSCULAR | Status: DC | PRN
  Administered 2016-08-28: 4 mg via INTRAVENOUS

## 2016-08-28 MED ORDER — ACETAMINOPHEN 10 MG/ML IV SOLN
INTRAVENOUS | Status: AC
  Filled 2016-08-28: qty 100

## 2016-08-28 MED ORDER — SODIUM CHLORIDE 0.9 % IR SOLN
Status: DC | PRN
  Administered 2016-08-28: 1000 mL

## 2016-08-28 MED ORDER — HYDROMORPHONE HCL 1 MG/ML IJ SOLN
INTRAMUSCULAR | Status: AC
  Filled 2016-08-28: qty 1

## 2016-08-28 MED ORDER — ACETAMINOPHEN 500 MG PO TABS
1000.0000 mg | ORAL_TABLET | Freq: Four times a day (QID) | ORAL | Status: AC
Start: 2016-08-28 — End: 2016-08-29
  Administered 2016-08-28 – 2016-08-29 (×4): 1000 mg via ORAL
  Filled 2016-08-28 (×4): qty 2

## 2016-08-28 MED ORDER — INSULIN ASPART 100 UNIT/ML ~~LOC~~ SOLN
0.0000 [IU] | Freq: Three times a day (TID) | SUBCUTANEOUS | Status: DC
  Administered 2016-08-29 (×2): 5 [IU] via SUBCUTANEOUS
  Administered 2016-08-29: 2 [IU] via SUBCUTANEOUS
  Administered 2016-08-30: 3 [IU] via SUBCUTANEOUS
  Filled 2016-08-28 (×2): qty 1

## 2016-08-28 MED ORDER — METOCLOPRAMIDE HCL 5 MG/ML IJ SOLN: 5.0000 mg | Freq: Three times a day (TID) | INTRAMUSCULAR | Status: DC | PRN

## 2016-08-28 MED ORDER — TRAMADOL HCL 50 MG PO TABS
50.0000 mg | ORAL_TABLET | Freq: Four times a day (QID) | ORAL | Status: DC | PRN
  Administered 2016-08-29: 50 mg via ORAL
  Filled 2016-08-28: qty 1

## 2016-08-28 MED ORDER — ACETAMINOPHEN 325 MG PO TABS
650.0000 mg | ORAL_TABLET | Freq: Four times a day (QID) | ORAL | Status: DC | PRN
  Administered 2016-08-30: 650 mg via ORAL
  Filled 2016-08-28 (×2): qty 2

## 2016-08-28 MED ORDER — LIDOCAINE HCL (CARDIAC) 20 MG/ML IV SOLN
INTRAVENOUS | Status: DC | PRN
  Administered 2016-08-28: 40 mg via INTRATRACHEAL

## 2016-08-28 SURGICAL SUPPLY — 52 items
BAG DECANTER FOR FLEXI CONT (MISCELLANEOUS) ×2 IMPLANT
BAG SPEC THK2 15X12 ZIP CLS (MISCELLANEOUS) ×1
BAG ZIPLOCK 12X15 (MISCELLANEOUS) ×2 IMPLANT
BANDAGE ACE 6X5 VEL STRL LF (GAUZE/BANDAGES/DRESSINGS) ×1 IMPLANT
BANDAGE ELASTIC 6 VELCRO ST LF (GAUZE/BANDAGES/DRESSINGS) ×1 IMPLANT
BLADE SAG 18X100X1.27 (BLADE) ×2 IMPLANT
BLADE SAW SGTL 11.0X1.19X90.0M (BLADE) ×2 IMPLANT
BOWL SMART MIX CTS (DISPOSABLE) ×2 IMPLANT
CAPT KNEE TOTAL 3 ATTUNE ×1 IMPLANT
CEMENT HV SMART SET (Cement) ×4 IMPLANT
CLOTH BEACON ORANGE TIMEOUT ST (SAFETY) ×2 IMPLANT
CUFF TOURN SGL QUICK 34 (TOURNIQUET CUFF) ×2
CUFF TRNQT CYL 34X4X40X1 (TOURNIQUET CUFF) ×1 IMPLANT
DECANTER SPIKE VIAL GLASS SM (MISCELLANEOUS) ×2 IMPLANT
DRAPE U-SHAPE 47X51 STRL (DRAPES) ×2 IMPLANT
DRSG ADAPTIC 3X8 NADH LF (GAUZE/BANDAGES/DRESSINGS) ×2 IMPLANT
DRSG PAD ABDOMINAL 8X10 ST (GAUZE/BANDAGES/DRESSINGS) ×1 IMPLANT
DURAPREP 26ML APPLICATOR (WOUND CARE) ×2 IMPLANT
ELECT REM PT RETURN 9FT ADLT (ELECTROSURGICAL) ×2
ELECTRODE REM PT RTRN 9FT ADLT (ELECTROSURGICAL) ×1 IMPLANT
EVACUATOR 1/8 PVC DRAIN (DRAIN) ×2 IMPLANT
GAUZE SPONGE 4X4 12PLY STRL (GAUZE/BANDAGES/DRESSINGS) ×2 IMPLANT
GLOVE BIO SURGEON STRL SZ7.5 (GLOVE) IMPLANT
GLOVE BIO SURGEON STRL SZ8 (GLOVE) ×2 IMPLANT
GLOVE BIOGEL PI IND STRL 6.5 (GLOVE) IMPLANT
GLOVE BIOGEL PI IND STRL 8 (GLOVE) ×1 IMPLANT
GLOVE BIOGEL PI INDICATOR 6.5 (GLOVE)
GLOVE BIOGEL PI INDICATOR 8 (GLOVE) ×1
GLOVE SURG SS PI 6.5 STRL IVOR (GLOVE) IMPLANT
GOWN STRL REUS W/TWL LRG LVL3 (GOWN DISPOSABLE) ×2 IMPLANT
GOWN STRL REUS W/TWL XL LVL3 (GOWN DISPOSABLE) IMPLANT
HANDPIECE INTERPULSE COAX TIP (DISPOSABLE) ×2
IMMOBILIZER KNEE 20 (SOFTGOODS) ×2
IMMOBILIZER KNEE 20 THIGH 36 (SOFTGOODS) ×1 IMPLANT
MANIFOLD NEPTUNE II (INSTRUMENTS) ×2 IMPLANT
NS IRRIG 1000ML POUR BTL (IV SOLUTION) ×2 IMPLANT
PACK TOTAL KNEE CUSTOM (KITS) ×2 IMPLANT
PAD ABD 8X10 STRL (GAUZE/BANDAGES/DRESSINGS) ×1 IMPLANT
PADDING CAST COTTON 6X4 STRL (CAST SUPPLIES) ×4 IMPLANT
POSITIONER SURGICAL ARM (MISCELLANEOUS) ×2 IMPLANT
SET HNDPC FAN SPRY TIP SCT (DISPOSABLE) ×1 IMPLANT
STRIP CLOSURE SKIN 1/2X4 (GAUZE/BANDAGES/DRESSINGS) ×3 IMPLANT
SUT MNCRL AB 4-0 PS2 18 (SUTURE) ×2 IMPLANT
SUT VIC AB 2-0 CT1 27 (SUTURE) ×6
SUT VIC AB 2-0 CT1 TAPERPNT 27 (SUTURE) ×3 IMPLANT
SUT VLOC 180 0 24IN GS25 (SUTURE) ×2 IMPLANT
SYR 50ML LL SCALE MARK (SYRINGE) ×2 IMPLANT
TRAY FOLEY W/METER SILVER 14FR (SET/KITS/TRAYS/PACK) ×1 IMPLANT
TRAY FOLEY W/METER SILVER 16FR (SET/KITS/TRAYS/PACK) ×2 IMPLANT
WATER STERILE IRR 1500ML POUR (IV SOLUTION) ×2 IMPLANT
WRAP KNEE MAXI GEL POST OP (GAUZE/BANDAGES/DRESSINGS) ×2 IMPLANT
YANKAUER SUCT BULB TIP 10FT TU (MISCELLANEOUS) ×2 IMPLANT

## 2016-08-28 NOTE — Progress Notes (Signed)
wears glasses

## 2016-08-28 NOTE — Anesthesia Postprocedure Evaluation (Signed)
Anesthesia Post Note  Patient: Bradley Gilmore  Procedure(s) Performed: Procedure(s) (LRB): RIGHT TOTAL KNEE ARTHROPLASTY (Right)  Patient location during evaluation: PACU Anesthesia Type: Spinal and MAC Level of consciousness: awake and alert Pain management: pain level controlled Vital Signs Assessment: post-procedure vital signs reviewed and stable Respiratory status: spontaneous breathing and respiratory function stable Cardiovascular status: blood pressure returned to baseline and stable Postop Assessment: spinal receding Anesthetic complications: no    Last Vitals:  Vitals:   08/28/16 1130 08/28/16 1135  BP: 131/69   Pulse: 65 66  Resp: 13 14  Temp: 36.7 C     Last Pain:  Vitals:   08/28/16 1135  TempSrc:   PainSc: 2     LLE Motor Response: Purposeful movement (08/28/16 1130) LLE Sensation: Tingling (08/28/16 1130) RLE Motor Response: Purposeful movement (08/28/16 1130) RLE Sensation: No tingling (08/28/16 1130) L Sensory Level: L5-Outer lower leg, top of foot, great toe (08/28/16 1130) R Sensory Level: L5-Outer lower leg, top of foot, great toe (08/28/16 1130)  Mialynn Shelvin,W. EDMOND

## 2016-08-28 NOTE — Anesthesia Preprocedure Evaluation (Addendum)
Anesthesia Evaluation  Patient identified by MRN, date of birth, ID band Patient awake    Reviewed: Allergy & Precautions, H&P , NPO status , Patient's Chart, lab work & pertinent test results  Airway Mallampati: III  TM Distance: >3 FB Neck ROM: Full    Dental no notable dental hx. (+) Teeth Intact, Dental Advisory Given   Pulmonary neg pulmonary ROS,    Pulmonary exam normal breath sounds clear to auscultation       Cardiovascular negative cardio ROS   Rhythm:Regular Rate:Normal     Neuro/Psych negative neurological ROS  negative psych ROS   GI/Hepatic negative GI ROS, Neg liver ROS,   Endo/Other  diabetes  Renal/GU negative Renal ROS  negative genitourinary   Musculoskeletal   Abdominal   Peds  Hematology negative hematology ROS (+)   Anesthesia Other Findings   Reproductive/Obstetrics negative OB ROS                            Anesthesia Physical Anesthesia Plan  ASA: II  Anesthesia Plan: MAC and Spinal   Post-op Pain Management:    Induction: Intravenous  Airway Management Planned: Simple Face Mask  Additional Equipment:   Intra-op Plan:   Post-operative Plan:   Informed Consent: I have reviewed the patients History and Physical, chart, labs and discussed the procedure including the risks, benefits and alternatives for the proposed anesthesia with the patient or authorized representative who has indicated his/her understanding and acceptance.   Dental advisory given  Plan Discussed with: CRNA  Anesthesia Plan Comments:         Anesthesia Quick Evaluation

## 2016-08-28 NOTE — Progress Notes (Signed)
Mepilex, bulky

## 2016-08-28 NOTE — Anesthesia Procedure Notes (Signed)
Spinal  Patient location during procedure: OR Start time: 08/28/2016 7:59 AM End time: 08/28/2016 8:02 AM Staffing Anesthesiologist: Roderic Palau Performed: anesthesiologist  Preanesthetic Checklist Completed: patient identified, surgical consent, pre-op evaluation, timeout performed, IV checked, risks and benefits discussed and monitors and equipment checked Spinal Block Patient position: sitting Prep: Betadine Patient monitoring: cardiac monitor, continuous pulse ox and blood pressure Approach: midline Location: L3-4 Injection technique: single-shot Needle Needle type: Pencan  Needle gauge: 24 G Needle length: 9 cm Assessment Sensory level: T8 Additional Notes Functioning IV was confirmed and monitors were applied. Sterile prep and drape, including hand hygiene and sterile gloves were used. The patient was positioned and the spine was prepped. The skin was anesthetized with lidocaine.  Free flow of clear CSF was obtained prior to injecting local anesthetic into the CSF.  The spinal needle aspirated freely following injection.  The needle was carefully withdrawn.  The patient tolerated the procedure well.

## 2016-08-28 NOTE — H&P (View-Only) (Signed)
Billey Toews Raia DOB: 03-09-1946 Married / Language: English / Race: White Male Date of Admission:  08/28/2016 CC:  Right Knee Pain History of Present Illness The patient is a 70 year old male who comes in for a preoperative History and Physical. The patient is scheduled for a right total knee arthroplasty to be performed by Dr. Dione Plover. Aluisio, MD at [facility] on 08/28/2016. The patient was seen in consultation for Dr. Erskine Emery. The patient reports right knee symptoms including: pain (medial side pain) and popping which began 4 year(s) ago without any known injury (pain started after the right knee scope). The patient describes their pain as sharp and aching. The patient feels that the symptoms are worsening. The patient has the current diagnosis of knee osteoarthritis. Past treatment for this problem has included intra-articular injection of corticosteroids. The patient does not report any radiation of symptoms. Current treatment includes nonsteroidal anti-inflammatory drugs (Tramadol w/ lidocaine patch). Note for "Knee pain": hx of right knee scope 2012-2013 Mr. Gloss comes in with significant pain and dysfunction in his right knee. This has gotten progressively worse over time. It hurts at all times now. This is limiting what he can and cannot do. He has had cortisone and viscosupplement injection in the past, which initially helped, but recently they have provided less and less benefit. The knee is preventing him from doing things he desires. He previously had a left total knee arthroplasty done by Dr. Alvan Dame about four years ago. 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 He said he still has some stiffness in that knee. Right knee, however, is far worse than the left. He is ready to get teh right knee fixed at this time. They have been treated conservatively in the past for the above stated problem and despite conservative measures, they continue to have  progressive pain and severe functional limitations and dysfunction. They have failed non-operative management including home exercise, medications, and injections. It is felt that they would benefit from undergoing total joint replacement. Risks and benefits of the procedure have been discussed with the patient and they elect to proceed with surgery. There are no active contraindications to surgery such as ongoing infection or rapidly progressive neurological disease.  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  Social History  Children  2 Current work status  retired Furniture conservator/restorer daily; does running / walking Living situation  live with spouse Marital status  married Never consumed alcohol  04/06/2016: Never consumed alcohol No history of drug/alcohol rehab  Not under pain contract  Number of flights of stairs before winded  2-3 Tobacco / smoke exposure  04/06/2016: no Tobacco use  Never smoker. 04/06/2016 Advance Directives  Living Will  Medication History  Vitamin D (Ergocalciferol) (50000UNIT Capsule, Oral) Active. Etodolac (400MG  Tablet, Oral) Active. Simvastatin (20MG  Tablet, Oral) Active. PriLOSEC (10MG  Capsule DR, Oral) Active. Glimepiride (4MG  Tablet, Oral) Active. MetFORMIN HCl ER (500MG  Tablet ER 24HR, Oral) Active.  Past Surgical History  Appendectomy  Cataract Surgery  bilateral Total Knee Replacement  left Vasectomy    Review of Systems  General Not Present- Chills, Fatigue,  Fever, Memory Loss, Night Sweats, Weight Gain and Weight  Loss. Skin Not Present- Eczema, Hives, Itching, Lesions and Rash. HEENT Present- Hearing Loss. Not Present- Dentures, Double Vision, Headache, Tinnitus and Visual Loss. Respiratory Not Present- Allergies, Chronic Cough, Coughing up blood, Shortness of breath at rest and Shortness of breath with exertion. Cardiovascular Not Present- Chest Pain, Difficulty Breathing Lying Down, Murmur, Palpitations, Racing/skipping heartbeats and Swelling. Gastrointestinal Not Present- Abdominal Pain, Bloody Stool, Constipation, Diarrhea, Difficulty Swallowing, Heartburn, Jaundice, Loss of appetitie, Nausea and Vomiting. Male Genitourinary Not Present- Blood in Urine, Discharge, Flank Pain, Incontinence, Painful Urination, Urgency, Urinary frequency, Urinary Retention, Urinating at Night and Weak urinary stream. Musculoskeletal Present- Back Pain, Joint Pain and Joint Swelling. Not Present- Morning Stiffness, Muscle Pain, Muscle Weakness and Spasms. Neurological Not Present- Blackout spells, Difficulty with balance, Dizziness, Paralysis, Tremor and Weakness. Psychiatric Not Present- Insomnia.  Vitals  Weight: 203 lb Height: 71in Body Surface Area: 2.12 m Body Mass Index: 28.31 kg/m  Pulse: 84 (Regular)  BP: 132/76 (Sitting, Right Arm, Standard)  Physical Exam General Mental Status -Alert, cooperative and good historian. General Appearance-pleasant, Not in acute distress. Orientation-Oriented X3. Build & Nutrition-Well nourished and Well developed.  Head and Neck Head-normocephalic, atraumatic . Neck Global Assessment - supple, no bruit auscultated on the right, no bruit auscultated on the left.  Eye Pupil - Bilateral-Regular and Round. Motion - Bilateral-EOMI.  Chest and Lung Exam Auscultation Breath sounds - clear at anterior chest wall and clear at posterior chest wall. Adventitious sounds - No Adventitious sounds.  Cardiovascular Auscultation Rhythm - Regular rate and  rhythm. Heart Sounds - S1 WNL and S2 WNL. Murmurs & Other Heart Sounds - Auscultation of the heart reveals - No Murmurs.  Abdomen Palpation/Percussion Tenderness - Abdomen is non-tender to palpation. Rigidity (guarding) - Abdomen is soft. Auscultation Auscultation of the abdomen reveals - Bowel sounds normal.  Male Genitourinary Note: Not done, not pertinent to present illness   Musculoskeletal Note: He is alert and oriented, in no apparent distress. His hips show normal range of motion, no discomfort. His right knee shows no effusion. He has got slight varus deformity. His range of motion is 5 to 130. There is moderate crepitus on range of motion. There is tenderness medial greater than lateral. There is no instability noted. Left knee, no effusion, range about 0 to 110 with no tenderness or instability. Pulse, sensation and motor intact both lower extremities. Significantly antalgic gait pattern on the right.  RADIOGRAPHS AP both knees and lateral show that he has bone on bone arthritis in the medial and patellofemoral compartments of the right knee. His left knee prosthesis appears to be in good position and no periprosthetic abnormalities.   Assessment & Plan Primary osteoarthritis of right knee (M17.11)  Note:Surgical Plans: Right Total Knee Replacement  Disposition: Home  PCP: Dr. Laurann Montana  IV TXA  Anesthesia Issues: None  VERITAS STUDY PATIENT Traditional Therapy  Signed electronically by Ok Edwards, III PA-C

## 2016-08-28 NOTE — Evaluation (Signed)
Physical Therapy Evaluation Patient Details Name: Bradley Gilmore MRN: [id number] DOB: 09-03-1946 Today's Date: 08/28/2016   History of Present Illness  RTKA  Clinical Impression  The patient ambulated x 60'. Plans Dc home with HHPT. Pt admitted with above diagnosis. Pt currently with functional limitations due to the deficits listed below (see PT Problem List). Pt will benefit from skilled PT to increase their independence and safety with mobility to allow discharge to the venue listed below.      Follow Up Recommendations Home health PT;Supervision/Assistance - 24 hour    Equipment Recommendations  None recommended by PT    Recommendations for Other Services       Precautions / Restrictions Precautions Precautions: Knee Required Braces or Orthoses: Knee Immobilizer - Right Knee Immobilizer - Right: Discontinue once straight leg raise with < 10 degree lag      Mobility  Bed Mobility Overal bed mobility: Needs Assistance Bed Mobility: Supine to Sit;Sit to Supine     Supine to sit: Min assist Sit to supine: Min assist   General bed mobility comments: assist with right leg  Transfers Overall transfer level: Needs assistance Equipment used: Rolling walker (2 wheeled) Transfers: Sit to/from Stand Sit to Stand: Min assist         General transfer comment: cues for hand and rt leg  Ambulation/Gait Ambulation/Gait assistance: Min assist Ambulation Distance (Feet): 60 Feet Assistive device: Rolling walker (2 wheeled) Gait Pattern/deviations: Step-to pattern;Trunk flexed;Decreased step length - right;Decreased stance time - right     General Gait Details: cues for sequence  Stairs            Wheelchair Mobility    Modified Rankin (Stroke Patients Only)       Balance                                             Pertinent Vitals/Pain Pain Assessment: 0-10 Pain Score: 4  Pain Location: rt knee Pain Descriptors / Indicators:  Aching;Tightness;Discomfort Pain Intervention(s): Repositioned;Premedicated before session;Ice applied;Monitored during session    [place] expects to be discharged to:: Private residence Living Arrangements: Spouse/significant other Available Help at Discharge: Family Type of Home: House Home Access: Stairs to enter Entrance Stairs-Rails: Psychiatric nurse of Steps: [place]: One [place]: Environmental consultant - 2 wheels;Bedside commode      Prior Function Level of Independence: Independent               Hand Dominance        Extremity/Trunk Assessment   Upper Extremity Assessment: Defer to OT evaluation           Lower Extremity Assessment: RLE deficits/detail RLE Deficits / Details: + slr       Communication      Cognition Arousal/Alertness: Awake/alert Behavior During Therapy: WFL for tasks assessed/performed Overall Cognitive Status: Within Functional Limits for tasks assessed                      General Comments      Exercises        Assessment/Plan    PT Assessment Patient needs continued PT services  PT Diagnosis Difficulty walking;Acute pain   PT Problem List Decreased range of motion;Decreased knowledge of use of DME;Decreased activity tolerance;Decreased safety awareness;Decreased knowledge of precautions;Decreased mobility;Pain  PT Treatment Interventions DME instruction;Gait training;Stair training;Functional  mobility training;Therapeutic activities;Therapeutic exercise;Patient/family education   PT Goals (Current goals can be found in the Care Plan section) Acute Rehab PT Goals Patient Stated Goal: to go home PT Goal Formulation: With patient/family Time For Goal Achievement: 09/01/16 Potential to Achieve Goals: Good    Frequency 7X/week   Barriers to discharge        Co-evaluation               End of Session Equipment Utilized During Treatment: Right knee  immobilizer Activity Tolerance: Patient tolerated treatment well Patient left: in bed;with call bell/phone within reach;with bed alarm set;with family/visitor present Nurse Communication: Mobility status         Time: 1554-1610 PT Time Calculation (min) (ACUTE ONLY): 16 min   Charges:   PT Evaluation $PT Eval Low Complexity: 1 Procedure     PT G CodesClaretha Gilmore 08/28/2016, 6:34 PM

## 2016-08-28 NOTE — Interval H&P Note (Signed)
History and Physical Interval Note:  08/28/2016 6:47 AM  [name]  has presented today for surgery, with the diagnosis of RIGHT KNEE OA  The various methods of treatment have been discussed with the patient and family. After consideration of risks, benefits and other options for treatment, the patient has consented to  Procedure(s): RIGHT TOTAL KNEE ARTHROPLASTY (Right) as a surgical intervention .  The patient's history has been reviewed, patient examined, no change in status, stable for surgery.  I have reviewed the patient's chart and labs.  Questions were answered to the patient's satisfaction.     Bradley Gilmore

## 2016-08-28 NOTE — Transfer of Care (Signed)
Immediate Anesthesia Transfer of Care Note  Patient: Bradley Gilmore  Procedure(s) Performed: Procedure(s): RIGHT TOTAL KNEE ARTHROPLASTY (Right)  Patient Location: PACU  Anesthesia Type:MAC and Spinal  Level of Consciousness: awake, alert  and oriented  Airway & Oxygen Therapy: Patient Spontanous Breathing and Patient connected to face mask oxygen  Post-op Assessment: Report given to RN and Post -op Vital signs reviewed and stable  Post vital signs: Reviewed and stable  Last Vitals:  Vitals:   08/28/16 0608 08/28/16 0935  BP: 137/64   Pulse: 85 69  Resp: 18   Temp: 37.2 C     Last Pain:  Vitals:   08/28/16 0641  TempSrc:   PainSc: 4       Patients Stated Pain Goal: 4 ([id number])  Complications: No apparent anesthesia complications

## 2016-08-28 NOTE — Op Note (Signed)
OPERATIVE REPORT-TOTAL KNEE ARTHROPLASTY   Pre-operative diagnosis- Osteoarthritis  Right knee(s)  Post-operative diagnosis- Osteoarthritis Right knee(s)  Procedure-  Right  Total Knee Arthroplasty  Surgeon- Dione Plover. Dontavia Brand, MD  Assistant- Arlee Muslim, PA-C   Anesthesia-  Spinal  EBL-* No blood loss amount entered *   Drains Hemovac  Tourniquet time-  Total Tourniquet Time Documented: Thigh (Right) - 33 minutes Total: Thigh (Right) - 33 minutes     Complications- None  Condition-PACU - hemodynamically stable.   Brief Clinical Note  Bradley Gilmore is a 70 y.o. year old male with end stage OA of his right knee with progressively worsening pain and dysfunction. He has constant pain, with activity and at rest and significant functional deficits with difficulties even with ADLs. He has had extensive non-op management including analgesics, injections of cortisone, and home exercise program, but remains in significant pain with significant dysfunction. Radiographs show bone on bone arthritis lateral and patellofemoral. He presents now for right Total Knee Arthroplasty.    Procedure in detail---   The patient is brought into the operating room and positioned supine on the operating table. After successful administration of  Spinal,   a tourniquet is placed high on the  Right thigh(s) and the lower extremity is prepped and draped in the usual sterile fashion. Time out is performed by the operating team and then the  Right lower extremity is wrapped in Esmarch, knee flexed and the tourniquet inflated to 300 mmHg.       A midline incision is made with a ten blade through the subcutaneous tissue to the level of the extensor mechanism. A fresh blade is used to make a medial parapatellar arthrotomy. Soft tissue over the proximal medial tibia is subperiosteally elevated to the joint line with a knife and into the semimembranosus bursa with a Cobb elevator. Soft tissue over the proximal  lateral tibia is elevated with attention being paid to avoiding the patellar tendon on the tibial tubercle. The patella is everted, knee flexed 90 degrees and the ACL and PCL are removed. Findings are bone on bone all 3 compartments with large global osteophytes.        The drill is used to create a starting hole in the distal femur and the canal is thoroughly irrigated with sterile saline to remove the fatty contents. The 5 degree Right  valgus alignment guide is placed into the femoral canal and the distal femoral cutting block is pinned to remove 10 mm off the distal femur. Resection is made with an oscillating saw.      The tibia is subluxed forward and the menisci are removed. The extramedullary alignment guide is placed referencing proximally at the medial aspect of the tibial tubercle and distally along the second metatarsal axis and tibial crest. The block is pinned to remove 48mm off the more deficient medial  side. Resection is made with an oscillating saw. Size 7is the most appropriate size for the tibia and the proximal tibia is prepared with the modular drill and keel punch for that size.      The femoral sizing guide is placed and size 6 is most appropriate. Rotation is marked off the epicondylar axis and confirmed by creating a rectangular flexion gap at 90 degrees. The size 6 cutting block is pinned in this rotation and the anterior, posterior and chamfer cuts are made with the oscillating saw. The intercondylar block is then placed and that cut is made.      Trial  size 7 tibial component, trial size 6 posterior stabilized femur and a 6  mm posterior stabilized rotating platform insert trial is placed. Full extension is achieved with excellent varus/valgus and anterior/posterior balance throughout full range of motion. The patella is everted and thickness measured to be 26  mm. Free hand resection is taken to 15 mm, a 38 template is placed, lug holes are drilled, trial patella is placed, and it  tracks normally. Osteophytes are removed off the posterior femur with the trial in place. All trials are removed and the cut bone surfaces prepared with pulsatile lavage. Cement is mixed and once ready for implantation, the size 7 tibial implant, size  6 posterior stabilized femoral component, and the size 38 patella are cemented in place and the patella is held with the clamp. The trial insert is placed and the knee held in full extension. The Exparel (20 ml mixed with 30 ml saline) and .25% Bupivicaine, are injected into the extensor mechanism, posterior capsule, medial and lateral gutters and subcutaneous tissues.  All extruded cement is removed and once the cement is hard the permanent 6 mm posterior stabilized rotating platform insert is placed into the tibial tray.      The wound is copiously irrigated with saline solution and the extensor mechanism closed over a hemovac drain with #1 V-loc suture. The tourniquet is released for a total tourniquet time of 33  minutes. Flexion against gravity is 140 degrees and the patella tracks normally. Subcutaneous tissue is closed with 2.0 vicryl and subcuticular with running 4.0 Monocryl. The incision is cleaned and dried and steri-strips and a bulky sterile dressing are applied. The limb is placed into a knee immobilizer and the patient is awakened and transported to recovery in stable condition.      Please note that a surgical assistant was a medical necessity for this procedure in order to perform it in a safe and expeditious manner. Surgical assistant was necessary to retract the ligaments and vital neurovascular structures to prevent injury to them and also necessary for proper positioning of the limb to allow for anatomic placement of the prosthesis.   Dione Plover Midas Daughety, MD    08/28/2016, 9:09 AM

## 2016-08-29 LAB — CBC
HEMATOCRIT: 31.9 % — AB (ref 39.0–52.0)
HEMOGLOBIN: 10.9 g/dL — AB (ref 13.0–17.0)
MCH: 30.1 pg (ref 26.0–34.0)
MCHC: 34.2 g/dL (ref 30.0–36.0)
MCV: 88.1 fL (ref 78.0–100.0)
Platelets: 221 10*3/uL (ref 150–400)
RBC: 3.62 MIL/uL — ABNORMAL LOW (ref 4.22–5.81)
RDW: 12.9 % (ref 11.5–15.5)
WBC: 9.6 10*3/uL (ref 4.0–10.5)

## 2016-08-29 LAB — GLUCOSE, CAPILLARY
GLUCOSE-CAPILLARY: 259 mg/dL — AB (ref 65–99)
Glucose-Capillary: 133 mg/dL — ABNORMAL HIGH (ref 65–99)
Glucose-Capillary: 203 mg/dL — ABNORMAL HIGH (ref 65–99)
Glucose-Capillary: 239 mg/dL — ABNORMAL HIGH (ref 65–99)

## 2016-08-29 LAB — BASIC METABOLIC PANEL
Anion gap: 6 (ref 5–15)
BUN: 22 mg/dL — AB (ref 6–20)
CHLORIDE: 106 mmol/L (ref 101–111)
CO2: 25 mmol/L (ref 22–32)
CREATININE: 1.32 mg/dL — AB (ref 0.61–1.24)
Calcium: 8.9 mg/dL (ref 8.9–10.3)
GFR calc Af Amer: >60 mL/min (ref >60)
GFR calc non Af Amer: 53 mL/min — ABNORMAL LOW (ref >60)
Glucose, Bld: 179 mg/dL — ABNORMAL HIGH (ref 65–99)
Potassium: 4.1 mmol/L (ref 3.5–5.1)
Sodium: 137 mmol/L (ref 135–145)

## 2016-08-29 MED ORDER — OMEPRAZOLE 20 MG PO CPDR
20.0000 mg | DELAYED_RELEASE_CAPSULE | Freq: Every day | ORAL | Status: DC
  Administered 2016-08-29 – 2016-08-30 (×2): 20 mg via ORAL
  Filled 2016-08-29 (×2): qty 1

## 2016-08-29 MED ORDER — METHOCARBAMOL 500 MG PO TABS: 500.0000 mg | ORAL_TABLET | Freq: Four times a day (QID) | ORAL | 0 refills | Status: DC | PRN

## 2016-08-29 MED ORDER — OXYCODONE HCL 5 MG PO TABS: 5.0000 mg | ORAL_TABLET | ORAL | 0 refills | Status: DC | PRN

## 2016-08-29 MED ORDER — TRAMADOL HCL 50 MG PO TABS: 50.0000 mg | ORAL_TABLET | Freq: Four times a day (QID) | ORAL | 1 refills | Status: DC | PRN

## 2016-08-29 MED ORDER — NON FORMULARY: 20.0000 mg | Freq: Every day | Status: DC

## 2016-08-29 MED ORDER — RIVAROXABAN 10 MG PO TABS: 10.0000 mg | ORAL_TABLET | Freq: Every day | ORAL | 0 refills | Status: DC

## 2016-08-29 NOTE — Discharge Summary (Signed)
Physician Discharge Summary   Patient ID: Bradley Gilmore MRN: [id number] DOB/AGE: Oct 27, 1946 70 y.o.  Admit date: 08/28/2016 Discharge date: 08/30/2016  Primary Diagnosis:  Osteoarthritis  Right knee(s)  Admission Diagnoses:  Past Medical History:  Diagnosis Date  . Diabetes mellitus   . Diabetes mellitus without complication ([place])   . Diverticulitis    Discharge Diagnoses:   Principal Problem:   OA (osteoarthritis) of knee  Estimated body mass index is 29.41 kg/m as calculated from the following:   Height as of this encounter: 5' 10"  (1.778 m).   Weight as of this encounter: 93 kg (205 lb).  Procedure:  Procedure(s) (LRB): RIGHT TOTAL KNEE ARTHROPLASTY (Right)   Consults: None  HPI: Bradley Gilmore is a 70 y.o. year old male with end stage OA of his right knee with progressively worsening pain and dysfunction. He has constant pain, with activity and at rest and significant functional deficits with difficulties even with ADLs. He has had extensive non-op management including analgesics, injections of cortisone, and home exercise program, but remains in significant pain with significant dysfunction. Radiographs show bone on bone arthritis lateral and patellofemoral. He presents now for right Total Knee Arthroplasty.   Laboratory Data: Admission on 08/28/2016  Component Date Value Ref Range Status  . Glucose-Capillary 08/28/2016 125* 65 - 99 mg/dL Final  . Comment 1 08/28/2016 Notify RN   Final  . Glucose-Capillary 08/28/2016 131* 65 - 99 mg/dL Final  . Comment 1 08/28/2016 Notify RN   Final  . Comment 2 08/28/2016 Document in Chart   Final  . WBC 08/29/2016 9.6  4.0 - 10.5 K/uL Final  . RBC 08/29/2016 3.62* 4.22 - 5.81 MIL/uL Final  . Hemoglobin 08/29/2016 10.9* 13.0 - 17.0 g/dL Final  . HCT 08/29/2016 31.9* 39.0 - 52.0 % Final  . MCV 08/29/2016 88.1  78.0 - 100.0 fL Final  . MCH 08/29/2016 30.1  26.0 - 34.0 pg Final  . MCHC 08/29/2016 34.2  30.0 - 36.0 g/dL Final    . RDW 08/29/2016 12.9  11.5 - 15.5 % Final  . Platelets 08/29/2016 221  150 - 400 K/uL Final  . Sodium 08/29/2016 137  135 - 145 mmol/L Final  . Potassium 08/29/2016 4.1  3.5 - 5.1 mmol/L Final  . Chloride 08/29/2016 106  101 - 111 mmol/L Final  . CO2 08/29/2016 25  22 - 32 mmol/L Final  . Glucose, Bld 08/29/2016 179* 65 - 99 mg/dL Final  . BUN 08/29/2016 22* 6 - 20 mg/dL Final  . Creatinine, Ser 08/29/2016 1.32* 0.61 - 1.24 mg/dL Final  . Calcium 08/29/2016 8.9  8.9 - 10.3 mg/dL Final  . GFR calc non Af Amer 08/29/2016 53* >60 mL/min Final  . GFR calc Af Amer 08/29/2016 >60  >60 mL/min Final   Comment: (NOTE) The eGFR has been calculated using the CKD EPI equation. This calculation has not been validated in all clinical situations. eGFR's persistently <60 mL/min signify possible Chronic Kidney Disease.   . Anion gap 08/29/2016 6  5 - 15 Final  . Glucose-Capillary 08/28/2016 276* 65 - 99 mg/dL Final  . Comment 1 08/28/2016 Notify RN   Final  . Comment 2 08/28/2016 Document in Chart   Final  . Glucose-Capillary 08/29/2016 133* 65 - 99 mg/dL Final  . Glucose-Capillary 08/29/2016 203* 65 - 99 mg/dL Final  . Glucose-Capillary 08/29/2016 239* 65 - 99 mg/dL Final  Hospital Outpatient Visit on 08/18/2016  Component Date Value Ref Range Status  .  aPTT 08/18/2016 29  24 - 36 seconds Final  . WBC 08/18/2016 6.0  4.0 - 10.5 K/uL Final  . RBC 08/18/2016 4.44  4.22 - 5.81 MIL/uL Final  . Hemoglobin 08/18/2016 13.2  13.0 - 17.0 g/dL Final  . HCT 08/18/2016 39.6  39.0 - 52.0 % Final  . MCV 08/18/2016 89.2  78.0 - 100.0 fL Final  . MCH 08/18/2016 29.7  26.0 - 34.0 pg Final  . MCHC 08/18/2016 33.3  30.0 - 36.0 g/dL Final  . RDW 08/18/2016 13.2  11.5 - 15.5 % Final  . Platelets 08/18/2016 235  150 - 400 K/uL Final  . Sodium 08/18/2016 138  135 - 145 mmol/L Final  . Potassium 08/18/2016 4.9  3.5 - 5.1 mmol/L Final  . Chloride 08/18/2016 107  101 - 111 mmol/L Final  . CO2 08/18/2016 27  22 - 32  mmol/L Final  . Glucose, Bld 08/18/2016 200* 65 - 99 mg/dL Final  . BUN 08/18/2016 25* 6 - 20 mg/dL Final  . Creatinine, Ser 08/18/2016 1.15  0.61 - 1.24 mg/dL Final  . Calcium 08/18/2016 9.7  8.9 - 10.3 mg/dL Final  . Total Protein 08/18/2016 7.3  6.5 - 8.1 g/dL Final  . Albumin 08/18/2016 4.2  3.5 - 5.0 g/dL Final  . AST 08/18/2016 25  15 - 41 U/L Final  . ALT 08/18/2016 23  17 - 63 U/L Final  . Alkaline Phosphatase 08/18/2016 63  38 - 126 U/L Final  . Total Bilirubin 08/18/2016 0.6  0.3 - 1.2 mg/dL Final  . GFR calc non Af Amer 08/18/2016 >60  >60 mL/min Final  . GFR calc Af Amer 08/18/2016 >60  >60 mL/min Final   Comment: (NOTE) The eGFR has been calculated using the CKD EPI equation. This calculation has not been validated in all clinical situations. eGFR's persistently <60 mL/min signify possible Chronic Kidney Disease.   . Anion gap 08/18/2016 4* 5 - 15 Final  . Prothrombin Time 08/18/2016 13.4  11.4 - 15.2 seconds Final  . INR 08/18/2016 1.02   Final  . ABO/RH(D) 08/28/2016 A POS   Final  . Antibody Screen 08/28/2016 NEG   Final  . Sample Expiration 08/28/2016 08/31/2016   Final  . Extend sample reason 08/28/2016 NO TRANSFUSIONS OR PREGNANCY IN THE PAST 3 MONTHS   Final  . Color, Urine 08/18/2016 YELLOW  YELLOW Final  . APPearance 08/18/2016 CLEAR  CLEAR Final  . Specific Gravity, Urine 08/18/2016 1.025  1.005 - 1.030 Final  . pH 08/18/2016 5.0  5.0 - 8.0 Final  . Glucose, UA 08/18/2016 500* NEGATIVE mg/dL Final  . Hgb urine dipstick 08/18/2016 NEGATIVE  NEGATIVE Final  . Bilirubin Urine 08/18/2016 LARGE* NEGATIVE Final  . Ketones, ur 08/18/2016 NEGATIVE  NEGATIVE mg/dL Final  . Protein, ur 08/18/2016 NEGATIVE  NEGATIVE mg/dL Final  . Nitrite 08/18/2016 NEGATIVE  NEGATIVE Final  . Leukocytes, UA 08/18/2016 NEGATIVE  NEGATIVE Final  . MRSA, PCR 08/18/2016 NEGATIVE  NEGATIVE Final  . Staphylococcus aureus 08/18/2016 NEGATIVE  NEGATIVE Final   Comment:        The Xpert SA  Assay (FDA approved for NASAL specimens in patients over 82 years of age), is one component of a comprehensive surveillance program.  Test performance has been validated by [facility] for patients greater than or equal to 34 year old. It is not intended to diagnose infection nor to guide or monitor treatment.      X-Rays:No results found.  EKG: Orders placed  or performed in visit on 08/18/16  . EKG 12-Lead     Hospital Course: Bradley Gilmore is a 70 y.o. who was admitted to [facility]. They were brought to the operating room on 08/28/2016 and underwent Procedure(s): RIGHT TOTAL KNEE ARTHROPLASTY.  Patient tolerated the procedure well and was later transferred to the recovery room and then to the orthopaedic floor for postoperative care.  They were given PO and IV analgesics for pain control following their surgery.  They were given 24 hours of postoperative antibiotics of  Anti-infectives    Start     Dose/Rate Route Frequency Ordered Stop   08/28/16 1400  ceFAZolin (ANCEF) IVPB 2g/100 mL premix     2 g 200 mL/hr over 30 Minutes Intravenous Every 6 hours 08/28/16 1155 08/28/16 2014   08/28/16 0606  ceFAZolin (ANCEF) IVPB 2g/100 mL premix     2 g 200 mL/hr over 30 Minutes Intravenous On call to O.R. 08/28/16 7035 08/28/16 0806     and started on DVT prophylaxis in the form of Xarelto.   PT and OT were ordered for total joint protocol.  Discharge planning consulted to help with postop disposition and equipment needs.  Patient had a decent night on the evening of surgery.  They started to get up OOB with therapy on day one. Hemovac drain was pulled without difficulty.  Continued to work with therapy into day two.  Dressing was changed on day two and the incision was healing well.  Patient was seen in rounds and was ready to go home.   Diet: Diabetic diet Activity:WBAT Follow-up:in 2 weeks Disposition - Home Discharged Condition: good   Discharge Instructions     Call MD / Call 911    Complete by:  As directed   If you experience chest pain or shortness of breath, CALL 911 and be transported to the hospital emergency room.  If you develope a fever above 101 F, pus (white drainage) or increased drainage or redness at the wound, or calf pain, call your surgeon's office.   Change dressing    Complete by:  As directed   Change dressing daily with sterile 4 x 4 inch gauze dressing and apply TED hose. Do not submerge the incision under water.   Constipation Prevention    Complete by:  As directed   Drink plenty of fluids.  Prune juice may be helpful.  You may use a stool softener, such as Colace (over the counter) 100 mg twice a day.  Use MiraLax (over the counter) for constipation as needed.   Diet Carb Modified    Complete by:  As directed   Discharge instructions    Complete by:  As directed   Pick up stool softner and laxative for home use following surgery while on pain medications. Do not submerge incision under water. Please use good hand washing techniques while changing dressing each day. May shower starting three days after surgery. Please use a clean towel to pat the incision dry following showers. Continue to use ice for pain and swelling after surgery. Do not use any lotions or creams on the incision until instructed by your surgeon.   Postoperative Constipation Protocol  Constipation - defined medically as fewer than three stools per week and severe constipation as less than one stool per week.  One of the most common issues patients have following surgery is constipation.  Even if you have a regular bowel pattern at home, your normal regimen is likely  to be disrupted due to multiple reasons following surgery.  Combination of anesthesia, postoperative narcotics, change in appetite and fluid intake all can affect your bowels.  In order to avoid complications following surgery, here are some recommendations in order to help you during your recovery  period.  Colace (docusate) - Pick up an over-the-counter form of Colace or another stool softener and take twice a day as long as you are requiring postoperative pain medications.  Take with a full glass of water daily.  If you experience loose stools or diarrhea, hold the colace until you stool forms back up.  If your symptoms do not get better within 1 week or if they get worse, check with your doctor.  Dulcolax (bisacodyl) - Pick up over-the-counter and take as directed by the product packaging as needed to assist with the movement of your bowels.  Take with a full glass of water.  Use this product as needed if not relieved by Colace only.   MiraLax (polyethylene glycol) - Pick up over-the-counter to have on hand.  MiraLax is a solution that will increase the amount of water in your bowels to assist with bowel movements.  Take as directed and can mix with a glass of water, juice, soda, coffee, or tea.  Take if you go more than two days without a movement. Do not use MiraLax more than once per day. Call your doctor if you are still constipated or irregular after using this medication for 7 days in a row.  If you continue to have problems with postoperative constipation, please contact the office for further assistance and recommendations.  If you experience "the worst abdominal pain ever" or develop nausea or vomiting, please contact the office immediatly for further recommendations for treatment.   Take Xarelto for two and a half more weeks, then discontinue Xarelto.   Do not put a pillow under the knee. Place it under the heel.    Complete by:  As directed   Do not sit on low chairs, stoools or toilet seats, as it may be difficult to get up from low surfaces    Complete by:  As directed   Driving restrictions    Complete by:  As directed   No driving until released by the physician.   Increase activity slowly as tolerated    Complete by:  As directed   Lifting restrictions    Complete by:  As  directed   No lifting until released by the physician.   Patient may shower    Complete by:  As directed   You may shower without a dressing once there is no drainage.  Do not wash over the wound.  If drainage remains, do not shower until drainage stops.   TED hose    Complete by:  As directed   Use stockings (TED hose) for 3 weeks on both leg(s).  You may remove them at night for sleeping.   Weight bearing as tolerated    Complete by:  As directed   Laterality:  right   Extremity:  Lower       Medication List    STOP taking these medications   cephALEXin 500 MG capsule Commonly known as:  KEFLEX   CO Q 10 PO   etodolac 400 MG tablet Commonly known as:  LODINE   GINSENG PO   HYDROcodone-acetaminophen 5-325 MG tablet Commonly known as:  NORCO/VICODIN   MULTIVITAMIN PO     TAKE these medications   glimepiride  4 MG tablet Commonly known as:  AMARYL Take 4 mg by mouth daily before breakfast.   metFORMIN 500 MG tablet Commonly known as:  GLUCOPHAGE Take 500 mg by mouth 2 (two) times daily with a meal.   methocarbamol 500 MG tablet Commonly known as:  ROBAXIN Take 1 tablet (500 mg total) by mouth every 6 (six) hours as needed for muscle spasms.   omeprazole 20 MG capsule Commonly known as:  PRILOSEC Take 20 mg by mouth daily.   oxyCODONE 5 MG immediate release tablet Commonly known as:  Oxy IR/ROXICODONE Take 1-2 tablets (5-10 mg total) by mouth every 3 (three) hours as needed for moderate pain or severe pain.   rivaroxaban 10 MG Tabs tablet Commonly known as:  XARELTO Take 1 tablet (10 mg total) by mouth daily with breakfast. Take Xarelto for two and a half more weeks, then discontinue Xarelto. Once the patient has completed the blood thinner regimen, then take a Baby 81 mg Aspirin daily for three more weeks.   simvastatin 20 MG tablet Commonly known as:  ZOCOR Take 20 mg by mouth daily.   tamsulosin 0.4 MG Caps capsule Commonly known as:  FLOMAX Take 1  capsule (0.4 mg total) by mouth daily after supper.   traMADol 50 MG tablet Commonly known as:  ULTRAM Take 1-2 tablets (50-100 mg total) by mouth every 6 (six) hours as needed (mild pain). What changed:  how much to take  reasons to take this      [facility] .   Why:  home health physical therapy Contact information: [place] [address] [phone]        Gearlean Alf, MD. Schedule an appointment as soon as possible for a visit on 09/12/2016.   Specialty:  Orthopedic Surgery Why:  Call for appointment for Tuesday 9/26 with Dr. Wynelle Link. Contact information: [address] [place] [address] [phone]           Signed: Arlee Muslim, PA-C Orthopaedic Surgery 08/29/2016, 10:34 PM

## 2016-08-29 NOTE — Discharge Instructions (Addendum)
°  ° °Dr. Frank Aluisio °Total Joint Specialist °[facility] °[address] °[place], [place] [address] °[phone] ° °TOTAL KNEE REPLACEMENT POSTOPERATIVE DIRECTIONS ° °Knee Rehabilitation, Guidelines Following Surgery  °Results after knee surgery are often greatly improved when you follow the exercise, range of motion and muscle strengthening exercises prescribed by your doctor. Safety measures are also important to protect the knee from further injury. Any time any of these exercises cause you to have increased pain or swelling in your knee joint, decrease the amount until you are comfortable again and slowly increase them. If you have problems or questions, call your caregiver or physical therapist for advice.  ° °HOME CARE INSTRUCTIONS  °Remove items at home which could result in a fall. This includes throw rugs or furniture in walking pathways.  °· ICE to the affected knee every three hours for 30 minutes at a time and then as needed for pain and swelling.  Continue to use ice on the knee for pain and swelling from surgery. You may notice swelling that will progress down to the foot and ankle.  This is normal after surgery.  Elevate the leg when you are not up walking on it.   °· Continue to use the breathing machine which will help keep your temperature down.  It is common for your temperature to cycle up and down following surgery, especially at night when you are not up moving around and exerting yourself.  The breathing machine keeps your lungs expanded and your temperature down. °· Do not place pillow under knee, focus on keeping the knee straight while resting ° °DIET °You may resume your previous home diet once your are discharged from the hospital. ° °DRESSING / WOUND CARE / SHOWERING °You may shower 3 days after surgery, but keep the wounds dry during showering.  You may use an occlusive plastic wrap (Press'n Seal for example), NO SOAKING/SUBMERGING IN THE BATHTUB.  If the  bandage gets wet, change with a clean dry gauze.  If the incision gets wet, pat the wound dry with a clean towel. °You may start showering once you are discharged home but do not submerge the incision under water. Just pat the incision dry and apply a dry gauze dressing on daily. °Change the surgical dressing daily and reapply a dry dressing each time. ° °ACTIVITY °Walk with your walker as instructed. °Use walker as long as suggested by your caregivers. °Avoid periods of inactivity such as sitting longer than an hour when not asleep. This helps prevent blood clots.  °You may resume a sexual relationship in one month or when given the OK by your doctor.  °You may return to work once you are cleared by your doctor.  °Do not drive a car for 6 weeks or until released by you surgeon.  °Do not drive while taking narcotics. ° °WEIGHT BEARING °Weight bearing as tolerated with assist device (walker, cane, etc) as directed, use it as long as suggested by your surgeon or therapist, typically at least 4-6 weeks. ° °POSTOPERATIVE CONSTIPATION PROTOCOL °Constipation - defined medically as fewer than three stools per week and severe constipation as less than one stool per week. ° °One of the most common issues patients have following surgery is constipation.  Even if you have a regular bowel pattern at home, your normal regimen is likely to be disrupted due to multiple reasons following surgery.  Combination of anesthesia, postoperative narcotics, change in appetite and fluid intake all can affect your   bowels.  In order to avoid complications following surgery, here are some recommendations in order to help you during your recovery period. ° °Colace (docusate) - Pick up an over-the-counter form of Colace or another stool softener and take twice a day as long as you are requiring postoperative pain medications.  Take with a full glass of water daily.  If you experience loose stools or diarrhea, hold the colace until you stool forms  back up.  If your symptoms do not get better within 1 week or if they get worse, check with your doctor. ° °Dulcolax (bisacodyl) - Pick up over-the-counter and take as directed by the product packaging as needed to assist with the movement of your bowels.  Take with a full glass of water.  Use this product as needed if not relieved by Colace only.  ° °MiraLax (polyethylene glycol) - Pick up over-the-counter to have on hand.  MiraLax is a solution that will increase the amount of water in your bowels to assist with bowel movements.  Take as directed and can mix with a glass of water, juice, soda, coffee, or tea.  Take if you go more than two days without a movement. °Do not use MiraLax more than once per day. Call your doctor if you are still constipated or irregular after using this medication for 7 days in a row. ° °If you continue to have problems with postoperative constipation, please contact the office for further assistance and recommendations.  If you experience "the worst abdominal pain ever" or develop nausea or vomiting, please contact the office immediatly for further recommendations for treatment. ° °ITCHING ° If you experience itching with your medications, try taking only a single pain pill, or even half a pain pill at a time.  You can also use Benadryl over the counter for itching or also to help with sleep.  ° °TED HOSE STOCKINGS °Wear the elastic stockings on both legs for three weeks following surgery during the day but you may remove then at night for sleeping. ° °MEDICATIONS °See your medication summary on the “After Visit Summary” that the nursing staff will review with you prior to discharge.  You may have some home medications which will be placed on hold until you complete the course of blood thinner medication.  It is important for you to complete the blood thinner medication as prescribed by your surgeon.  Continue your approved medications as instructed at time of  discharge. ° °PRECAUTIONS °If you experience chest pain or shortness of breath - call 911 immediately for transfer to the hospital emergency department.  °If you develop a fever greater that 101 F, purulent drainage from wound, increased redness or drainage from wound, foul odor from the wound/dressing, or calf pain - CONTACT YOUR SURGEON.   °                                                °FOLLOW-UP APPOINTMENTS °Make sure you keep all of your appointments after your operation with your surgeon and caregivers. You should call the office at the above phone number and make an appointment for approximately two weeks after the date of your surgery or on the date instructed by your surgeon outlined in the "After Visit Summary". ° ° °RANGE OF MOTION AND STRENGTHENING EXERCISES  °Rehabilitation of the knee is important following a knee   injury or an operation. After just a few days of immobilization, the muscles of the thigh which control the knee become weakened and shrink (atrophy). Knee exercises are designed to build up the tone and strength of the thigh muscles and to improve knee motion. Often times heat used for twenty to thirty minutes before working out will loosen up your tissues and help with improving the range of motion but do not use heat for the first two weeks following surgery. These exercises can be done on a training (exercise) mat, on the floor, on a table or on a bed. Use what ever works the best and is most comfortable for you Knee exercises include:  Leg Lifts - While your knee is still immobilized in a splint or cast, you can do straight leg raises. Lift the leg to 60 degrees, hold for 3 sec, and slowly lower the leg. Repeat 10-20 times 2-3 times daily. Perform this exercise against resistance later as your knee gets better.  Quad and Hamstring Sets - Tighten up the muscle on the front of the thigh (Quad) and hold for 5-10 sec. Repeat this 10-20 times hourly. Hamstring sets are done by pushing the  foot backward against an object and holding for 5-10 sec. Repeat as with quad sets.   Leg Slides: Lying on your back, slowly slide your foot toward your buttocks, bending your knee up off the floor (only go as far as is comfortable). Then slowly slide your foot back down until your leg is flat on the floor again.  Angel Wings: Lying on your back spread your legs to the side as far apart as you can without causing discomfort.  A rehabilitation program following serious knee injuries can speed recovery and prevent re-injury in the future due to weakened muscles. Contact your doctor or a physical therapist for more information on knee rehabilitation.   IF YOU ARE TRANSFERRED TO A SKILLED REHAB FACILITY If the patient is transferred to a skilled rehab facility following release from the hospital, a list of the current medications will be sent to the facility for the patient to continue.  When discharged from the skilled rehab facility, please have the facility set up the patient's [place] prior to being released. Also, the skilled facility will be responsible for providing the patient with their medications at time of release from the facility to include their pain medication, the muscle relaxants, and their blood thinner medication. If the patient is still at the rehab facility at time of the two week follow up appointment, the skilled rehab facility will also need to assist the patient in arranging follow up appointment in our office and any transportation needs.  MAKE SURE YOU:  Understand these instructions.  Get help right away if you are not doing well or get worse.    Pick up stool softner and laxative for home use following surgery while on pain medications. Do not submerge incision under water. Please use good hand washing techniques while changing dressing each day. May shower starting three days after surgery. Please use a clean towel to pat the incision dry following  showers. Continue to use ice for pain and swelling after surgery. Do not use any lotions or creams on the incision until instructed by your surgeon.  Take Xarelto for two and a half more weeks, then discontinue Xarelto. Once the patient has completed the blood thinner regimen, then take a Baby 81 mg Aspirin daily for three more weeks.  Information on my medicine - XARELTO (Rivaroxaban)  This medication education was reviewed with me or my healthcare representative as part of my discharge preparation.  The pharmacist that spoke with me during my hospital stay was:  Altha Harm  Why was Xarelto prescribed for you? Xarelto was prescribed for you to reduce the risk of blood clots forming after orthopedic surgery. The medical term for these abnormal blood clots is venous thromboembolism (VTE).  What do you need to know about xarelto ? Take your Xarelto ONCE DAILY at the same time every day. You may take it either with or without food.  If you have difficulty swallowing the tablet whole, you may crush it and mix in applesauce just prior to taking your dose.  Take Xarelto exactly as prescribed by your doctor and DO NOT stop taking Xarelto without talking to the doctor who prescribed the medication.  Stopping without other VTE prevention medication to take the place of Xarelto may increase your risk of developing a clot.  After discharge, you should have regular check-up appointments with your healthcare provider that is prescribing your Xarelto.    What do you do if you miss a dose? If you miss a dose, take it as soon as you remember on the same day then continue your regularly scheduled once daily regimen the next day. Do not take two doses of Xarelto on the same day.   Important Safety Information A possible side effect of Xarelto is bleeding. You should call your healthcare provider right away if you experience any of the following: ? Bleeding from an injury or your nose that does  not stop. ? Unusual colored urine (red or dark brown) or unusual colored stools (red or black). ? Unusual bruising for unknown reasons. ? A serious fall or if you hit your head (even if there is no bleeding).  Some medicines may interact with Xarelto and might increase your risk of bleeding while on Xarelto. To help avoid this, consult your healthcare provider or pharmacist prior to using any new prescription or non-prescription medications, including herbals, vitamins, non-steroidal anti-inflammatory drugs (NSAIDs) and supplements.  This website has more information on Xarelto: [email or web address]

## 2016-08-29 NOTE — Progress Notes (Signed)
   Subjective: 1 Day Post-Op Procedure(s) (LRB): RIGHT TOTAL KNEE ARTHROPLASTY (Right) Patient reports pain as mild.   Patient seen in rounds by Dr. Wynelle Link. Patient is well, and has had no acute complaints or problems We will resume therapy today.  He walked 60 feet yesterday with PT. Plan is to go Home after hospital stay.  Objective: Vital signs in last 24 hours: Temp:  [97.6 F (36.4 C)-98.7 F (37.1 C)] 98.5 F (36.9 C) (09/12 0445) Pulse Rate:  [57-85] 75 (09/12 0445) Resp:  [8-20] 20 (09/12 0445) BP: (107-145)/(50-88) 120/58 (09/12 0445) SpO2:  [93 %-99 %] 99 % (09/12 0445)  Intake/Output from previous day:  Intake/Output Summary (Last 24 hours) at 08/29/16 0826 Last data filed at 08/29/16 0826  Gross per 24 hour  Intake             4070 ml  Output             5335 ml  Net            -1265 ml    Intake/Output this shift: Total I/O In: -  Out: 175 [Urine:175]  Labs:  Recent Labs  08/29/16 0443  HGB 10.9*    Recent Labs  08/29/16 0443  WBC 9.6  RBC 3.62*  HCT 31.9*  PLT 221    Recent Labs  08/29/16 0443  NA 137  K 4.1  CL 106  CO2 25  BUN 22*  CREATININE 1.32*  GLUCOSE 179*  CALCIUM 8.9   No results for input(s): LABPT, INR in the last 72 hours.  EXAM General - Patient is Alert, Appropriate and Oriented Extremity - Neurovascular intact Sensation intact distally Dorsiflexion/Plantar flexion intact Dressing - dressing C/D/I Motor Function - intact, moving foot and toes well on exam.  Hemovac pulled without difficulty.  Past Medical History:  Diagnosis Date  . Diabetes mellitus   . Diabetes mellitus without complication ([place])   . Diverticulitis     Assessment/Plan: 1 Day Post-Op Procedure(s) (LRB): RIGHT TOTAL KNEE ARTHROPLASTY (Right) Principal Problem:   OA (osteoarthritis) of knee  Estimated body mass index is 29.41 kg/m as calculated from the following:   Height as of this encounter: 5\' 10"  (1.778 m).   Weight as of this  encounter: 93 kg (205 lb). Advance diet Up with therapy Plan for discharge tomorrow Discharge home with home health  DVT Prophylaxis - Xarelto Weight-Bearing as tolerated to right leg D/C O2 and Pulse OX and try on Room Air  [place], PA-C Orthopaedic Surgery 08/29/2016, 8:26 AM

## 2016-08-29 NOTE — Progress Notes (Signed)
Physical Therapy Treatment Patient Details Name: Bradley Gilmore MRN: [id number] DOB: 04/09/1946 Today's Date: 08/29/2016    History of Present Illness RTKA    PT Comments    POD # 1 am session Applied KI and instructed on use for amb and stairs.   Assisted spouse with "hands on" gait and transfer training.  Practiced stairs same fashion.    Follow Up Recommendations  Home health PT;Supervision/Assistance - 24 hour     Equipment Recommendations  None recommended by PT    Recommendations for Other Services       Precautions / Restrictions Precautions Precautions: Fall Precaution Comments: instructed on KI use for amb and stairs Required Braces or Orthoses: Knee Immobilizer - Right Knee Immobilizer - Right: Discontinue once straight leg raise with < 10 degree lag Restrictions Weight Bearing Restrictions: No Other Position/Activity Restrictions: WBAT    Mobility  Bed Mobility               General bed mobility comments: OOB in recliner  Transfers Overall transfer level: Needs assistance Equipment used: Rolling walker (2 wheeled) Transfers: Sit to/from Stand Sit to Stand: Min guard;From elevated surface         General transfer comment: cues for hand and rt leg  Ambulation/Gait Ambulation/Gait assistance: Min guard;Supervision Ambulation Distance (Feet): 65 Feet Assistive device: Rolling walker (2 wheeled) Gait Pattern/deviations: Step-to pattern;Decreased stance time - right     General Gait Details: cues for sequence   Stairs Stairs: Yes Stairs assistance: Min guard Stair Management: One rail Right;Step to pattern;Forwards;With crutches Number of Stairs: 2 General stair comments: with spouse practiced stairs using one crutch and one rail as pt is unable to reach both rails at home.  Instructed to wear KI for increased support.   Wheelchair Mobility    Modified Rankin (Stroke Patients Only)       Balance                                     Cognition Arousal/Alertness: Awake/alert Behavior During Therapy: WFL for tasks assessed/performed Overall Cognitive Status: Within Functional Limits for tasks assessed                      Exercises      General Comments        Pertinent Vitals/Pain Pain Assessment: 0-10 Pain Score: 5  Pain Location: R knee Pain Descriptors / Indicators: Aching;Sore Pain Intervention(s): Monitored during session;Repositioned;Ice applied    Home Living                      Prior Function            PT Goals (current goals can now be found in the care plan section) Progress towards PT goals: Progressing toward goals    Frequency  7X/week    PT Plan Current plan remains appropriate    Co-evaluation             End of Session Equipment Utilized During Treatment: Right knee immobilizer Activity Tolerance: Patient tolerated treatment well Patient left: in chair;with family/visitor present     Time: [id number] PT Time Calculation (min) (ACUTE ONLY): 26 min  Charges:  $Gait Training: 8-22 mins $Therapeutic Activity: 8-22 mins                    G Codes:  Rica Koyanagi  PTA [facility]  Acute  Rehab Pager      [phone]

## 2016-08-29 NOTE — Progress Notes (Signed)
Occupational Therapy Evaluation Patient Details Name: Bradley Gilmore MRN: [id number] DOB: [birth year] Today's Date: 08/29/2016    History of Present Illness RTKA   Clinical Impression   Patient is s/p R TKA and presents with the deficits listed below. He will benefit from skilled OT to maximize function and to facilitate a safe discharge. OT will follow.    Follow Up Recommendations  No OT follow up;Supervision/Assistance - 24 hour    Equipment Recommendations  None recommended by OT    Recommendations for Other Services       Precautions / Restrictions Precautions Precautions: Knee Required Braces or Orthoses: Knee Immobilizer - Right Knee Immobilizer - Right: Discontinue once straight leg raise with < 10 degree lag Restrictions Weight Bearing Restrictions: No Other Position/Activity Restrictions: WBAT      Mobility Bed Mobility Overal bed mobility: Needs Assistance Bed Mobility: Supine to Sit     Supine to sit: Min assist     General bed mobility comments: assist with right leg  Transfers Overall transfer level: Needs assistance Equipment used: Rolling walker (2 wheeled) Transfers: Sit to/from Stand Sit to Stand: Min guard;From elevated surface         General transfer comment: cues for hand and rt leg    Balance                                            ADL Overall ADL's : Needs assistance/impaired Eating/Feeding: Independent;Sitting   Grooming: Min guard;Standing               Lower Body Dressing: Moderate assistance;Sit to/from stand   Toilet Transfer: Min guard;Ambulation;BSC;RW   Toileting- Clothing Manipulation and Hygiene: Supervision/safety;Sit to/from stand       Functional mobility during ADLs: Min guard;Rolling walker;Cueing for sequencing General ADL Comments: Patient plans to sponge bathe initially. Will review tub transfer technique with patient next session. Wife can assist with ADLs as needed.      Vision     Perception     Praxis      Pertinent Vitals/Pain Pain Assessment: 0-10 Pain Score: 5  Pain Location: R knee with mobility Pain Descriptors / Indicators: Aching;Sore Pain Intervention(s): Limited activity within patient's tolerance;Monitored during session;Repositioned;Ice applied     Hand Dominance     Extremity/Trunk Assessment Upper Extremity Assessment Upper Extremity Assessment: Overall WFL for tasks assessed   Lower Extremity Assessment Lower Extremity Assessment: Defer to PT evaluation       Communication Communication Communication: No difficulties   Cognition Arousal/Alertness: Awake/alert Behavior During Therapy: WFL for tasks assessed/performed Overall Cognitive Status: Within Functional Limits for tasks assessed                     General Comments       Exercises       Shoulder Instructions      Home Living Family/patient expects to be discharged to:: Private residence Living Arrangements: Spouse/significant other Available Help at Discharge: Family Type of Home: House Home Access: Stairs to enter Technical brewer of Steps: 3 Entrance Stairs-Rails: Right;Left Home Layout: One level     Bathroom Shower/Tub: Teacher, Bradley Gilmore years/pre: Standard Bathroom Accessibility: Yes How Accessible: Accessible via walker Home Equipment: [place] - 2 wheels;Bedside commode          Prior Functioning/Environment Level of Independence: Independent  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    OT Diagnosis: Acute pain   OT Problem List: Decreased strength;Decreased range of motion;Decreased knowledge of use of DME or AE;Pain   OT Treatment/Interventions: Self-care/ADL training;DME and/or AE instruction;Therapeutic activities;Patient/family education    OT Goals(Current goals can be found in the care plan section) Acute Rehab OT Goals Patient Stated Goal: to go home OT Goal  Formulation: With patient Time For Goal Achievement: 09/12/16 Potential to Achieve Goals: Good ADL Goals Pt Will Perform Lower Body Bathing: with min assist;sit to/from stand Pt Will Perform Lower Body Dressing: with min assist;sit to/from stand Pt Will Transfer to Toilet: with supervision;ambulating;bedside commode Pt Will Perform Toileting - Clothing Manipulation and hygiene: with supervision;sit to/from stand Pt Will Perform Tub/Shower Transfer: Tub transfer;with min assist;ambulating;rolling walker  OT Frequency: Min 2X/week   Barriers to D/C:            Co-evaluation              End of Session Equipment Utilized During Treatment: Rolling walker;Right knee immobilizer Nurse Communication: Patient requests pain meds;Mobility status  Activity Tolerance: Patient tolerated treatment well Patient left: in chair;with call bell/phone within reach;with chair alarm set   Time: [id number] OT Time Calculation (min): 41 min Charges:  OT General Charges $OT Visit: 1 Procedure OT Evaluation $OT Eval Low Complexity: 1 Procedure OT Treatments $Self Care/Home Management : 23-37 mins G-Codes:    Bradley Gilmore A 09/06/16, 10:05 AM

## 2016-08-29 NOTE — Progress Notes (Signed)
Physical Therapy Treatment Patient Details Name: Bradley Gilmore MRN: [id number] DOB: September 14, 1946 Today's Date: 08/29/2016    History of Present Illness RTKA    PT Comments    POD # 1 pm session Assisted with amb a second time with spouse a greater distance then back to bed for TE's and CPM.    Follow Up Recommendations  Home health PT;Supervision/Assistance - 24 hour     Equipment Recommendations  None recommended by PT    Recommendations for Other Services       Precautions / Restrictions Precautions Precautions: Fall Precaution Comments: instructed on KI use for amb and stairs Required Braces or Orthoses: Knee Immobilizer - Right Knee Immobilizer - Right: Discontinue once straight leg raise with < 10 degree lag Restrictions Weight Bearing Restrictions: No Other Position/Activity Restrictions: WBAT    Mobility  Bed Mobility               General bed mobility comments: OOB in recliner  Transfers Overall transfer level: Needs assistance Equipment used: Rolling walker (2 wheeled) Transfers: Sit to/from Stand Sit to Stand: Min guard;From elevated surface         General transfer comment: cues for hand and rt leg  Ambulation/Gait Ambulation/Gait assistance: Min guard;Supervision Ambulation Distance (Feet): 82 Feet Assistive device: Rolling walker (2 wheeled) Gait Pattern/deviations: Step-to pattern;Decreased stance time - right     General Gait Details: cues for sequence   Stairs  Wheelchair Mobility    Modified Rankin (Stroke Patients Only)       Balance                                    Cognition Arousal/Alertness: Awake/alert Behavior During Therapy: WFL for tasks assessed/performed Overall Cognitive Status: Within Functional Limits for tasks assessed                      Exercises   Total Knee Replacement TE's 10 reps B LE ankle pumps 10 reps towel squeezes 10 reps knee presses 10 reps heel slides  10  reps SAQ's 10 reps SLR's 10 reps ABD Followed by ICE    General Comments        Pertinent Vitals/Pain Pain Assessment: 0-10 Pain Score: 5  Pain Location: R knee Pain Descriptors / Indicators: Aching;Sore Pain Intervention(s): Monitored during session;Repositioned;Ice applied    Home Living                      Prior Function            PT Goals (current goals can now be found in the care plan section) Progress towards PT goals: Progressing toward goals    Frequency  7X/week    PT Plan Current plan remains appropriate    Co-evaluation             End of Session Equipment Utilized During Treatment: Right knee immobilizer Activity Tolerance: Patient tolerated treatment well Patient left: in chair;with family/visitor present     Time: [id number] PT Time Calculation (min) (ACUTE ONLY): 26 min  Charges:  $Gait Training: 8-22 mins $Therapeutic Exercise: 8-22 mins                    G Codes:      Rica Koyanagi  PTA [facility]  Acute  Rehab Pager      [phone]

## 2016-08-30 LAB — CBC
HEMATOCRIT: 32.1 % — AB (ref 39.0–52.0)
Hemoglobin: 11.1 g/dL — ABNORMAL LOW (ref 13.0–17.0)
MCH: 29.7 pg (ref 26.0–34.0)
MCHC: 34.6 g/dL (ref 30.0–36.0)
MCV: 85.8 fL (ref 78.0–100.0)
Platelets: 228 10*3/uL (ref 150–400)
RBC: 3.74 MIL/uL — AB (ref 4.22–5.81)
RDW: 12.9 % (ref 11.5–15.5)
WBC: 9.2 10*3/uL (ref 4.0–10.5)

## 2016-08-30 LAB — BASIC METABOLIC PANEL
ANION GAP: 7 (ref 5–15)
BUN: 23 mg/dL — ABNORMAL HIGH (ref 6–20)
CO2: 27 mmol/L (ref 22–32)
Calcium: 9 mg/dL (ref 8.9–10.3)
Chloride: 104 mmol/L (ref 101–111)
Creatinine, Ser: 1.28 mg/dL — ABNORMAL HIGH (ref 0.61–1.24)
GFR, EST NON AFRICAN AMERICAN: 55 mL/min — AB (ref >60)
GLUCOSE: 149 mg/dL — AB (ref 65–99)
POTASSIUM: 3.9 mmol/L (ref 3.5–5.1)
Sodium: 138 mmol/L (ref 135–145)

## 2016-08-30 LAB — GLUCOSE, CAPILLARY
GLUCOSE-CAPILLARY: 115 mg/dL — AB (ref 65–99)
GLUCOSE-CAPILLARY: 183 mg/dL — AB (ref 65–99)

## 2016-08-30 NOTE — Progress Notes (Signed)
Physical Therapy Treatment Patient Details Name: Bradley Gilmore MRN: [id number] DOB: 1946/01/10 Today's Date: 08/30/2016    History of Present Illness RTKA    PT Comments    POD # 2 Second session for HEP. With spouse present, performed and eductaed on all TKR TE's following HEP handout.  Instructed on proper tech and freq as well as use of ICE.   All therapy questions addressed, pt ready for D/C to home.   Follow Up Recommendations  Home health PT;Supervision/Assistance - 24 hour     Equipment Recommendations  None recommended by PT    Recommendations for Other Services       Precautions / Restrictions Precautions Precautions: Fall Precaution Comments: instructed on KI use for amb and stairs Required Braces or Orthoses: Knee Immobilizer - Right Knee Immobilizer - Right: Discontinue once straight leg raise with < 10 degree lag Restrictions Weight Bearing Restrictions: No Other Position/Activity Restrictions: WBAT                                           Cognition Arousal/Alertness: Awake/alert Behavior During Therapy: WFL for tasks assessed/performed Overall Cognitive Status: Within Functional Limits for tasks assessed                      Exercises   Total Knee Replacement TE's 10 reps B LE ankle pumps 10 reps towel squeezes 10 reps knee presses 10 reps heel slides  10 reps SAQ's 10 reps SLR's 10 reps ABD Followed by ICE    General Comments        Pertinent Vitals/Pain Pain Assessment: 0-10 Pain Score: 2  Pain Location: R knee Pain Descriptors / Indicators: Aching;Tender Pain Intervention(s): Monitored during session;Repositioned;Ice applied    Home Living                      Prior Function            PT Goals (current goals can now be found in the care plan section) Progress towards PT goals: Progressing toward goals    Frequency  7X/week    PT Plan Current plan remains appropriate     Co-evaluation             End of Session Equipment Utilized During Treatment: Right knee immobilizer Activity Tolerance: Patient tolerated treatment well Patient left: in bed;with call bell/phone within reach;with family/visitor present     Time: [id number] PT Time Calculation (min) (ACUTE ONLY): 26 min  Charges:  $Therapeutic Exercise: 23-37 mins                     G Codes:      Rica Koyanagi  PTA [facility]  Acute  Rehab Pager      [phone]

## 2016-08-30 NOTE — Care Management Note (Signed)
Case Management Note  Patient Details  Name: Bradley Gilmore MRN: [id number] Date of Birth: 1946/09/24  Subjective/Objective:                  Right  Total Knee Arthroplasty Action/Plan: Discharge planning Expected Discharge Date:  08/30/16               Expected Discharge Plan:  [place]  In-House Referral:     Discharge planning Services  CM Consult  Post Acute Care Choice:  Home Health Choice offered to:  Patient  DME Arranged:  Walker rolling DME Agency:  [place]:  PT [place] Agency:  [facility] ([facility])  Status of Service:  Completed, signed off  If discussed at [employer] of Stay Meetings, dates discussed:    Additional Comments: CM met with pt in room to offer choice of home health agency.  Pt chooses [facility] of [facility] to render HHPT.  Referral given to [facility] rep, Manuela Schwartz.  Unfortunately, Manuela Schwartz states she needs approval from [place] and gives CM contact [place] at [facility] [phone].  CM called Larene Beach and explained pt was presurgically setup with [facility] but now wishes to have [facility].  Larene Beach states pt's surgery, HH, and DMe has been approved as a bundle and to re approve for another [facility] agency it will be a 48-72 hour turnaround (pt's RTKA and requires PT prior to receiving approval).  CM notifies pt who states [facility] will be fine.  Referral made to [facility] rep, Tim.  CM notified [facility] rep, Manuela Schwartz to cancel approval.  CM notified [facility] DME rep, Jermaine to please deliver the rolling walker to room so pt can discharge.  No other CM needs were communicated. Dellie Catholic, RN 08/30/2016, 9:51 AM

## 2016-08-30 NOTE — Progress Notes (Signed)
Physical Therapy Treatment Patient Details Name: Bradley Gilmore MRN: [id number] DOB: 09/03/1946 Today's Date: 08/30/2016    History of Present Illness RTKA    PT Comments    POD # 2 am session Had spouse apply KI under Therapist instruction.  Had spouse "hands on" assist pt OOB to amb in hallway.  Practiced stairs then returned to room to rest and receive pain meds.  Applied ICE.    Follow Up Recommendations  Home health PT;Supervision/Assistance - 24 hour     Equipment Recommendations  None recommended by PT    Recommendations for Other Services       Precautions / Restrictions Precautions Precautions: Fall Precaution Comments: instructed on KI use for amb and stairs Required Braces or Orthoses: Knee Immobilizer - Right Knee Immobilizer - Right: Discontinue once straight leg raise with < 10 degree lag Restrictions Weight Bearing Restrictions: No Other Position/Activity Restrictions: WBAT    Mobility  Bed Mobility Overal bed mobility: Needs Assistance Bed Mobility: Supine to Sit     Supine to sit: Min assist;Min guard     General bed mobility comments: assist R LE  Transfers Overall transfer level: Needs assistance Equipment used: Rolling walker (2 wheeled) Transfers: Sit to/from Stand Sit to Stand: Supervision         General transfer comment: cues for hand and rt leg  Ambulation/Gait Ambulation/Gait assistance: Supervision;Min guard Ambulation Distance (Feet): 84 Feet Assistive device: Rolling walker (2 wheeled) Gait Pattern/deviations: Step-to pattern;Decreased stance time - right Gait velocity: decreased   General Gait Details: 25% VC's safety with turns    Stairs Stairs: Yes Stairs assistance: Min guard Stair Management: One rail Right;Forwards;With crutches Number of Stairs: 2 General stair comments: 50% VC's on proper sequencing and use of one crutch with one rail.  Instructed to wear KI for increased support.    Wheelchair Mobility     Modified Rankin (Stroke Patients Only)       Balance                                    Cognition Arousal/Alertness: Awake/alert Behavior During Therapy: WFL for tasks assessed/performed Overall Cognitive Status: Within Functional Limits for tasks assessed                      Exercises      General Comments        Pertinent Vitals/Pain Pain Assessment: 0-10 Pain Score: 2  Pain Location: R knee Pain Descriptors / Indicators: Aching;Tender Pain Intervention(s): Monitored during session;Repositioned;Ice applied    Home Living                      Prior Function            PT Goals (current goals can now be found in the care plan section) Progress towards PT goals: Progressing toward goals    Frequency  7X/week    PT Plan Current plan remains appropriate    Co-evaluation             End of Session Equipment Utilized During Treatment: Right knee immobilizer Activity Tolerance: Patient tolerated treatment well Patient left: in bed;with call bell/phone within reach;with family/visitor present     Time: 1012-1037 PT Time Calculation (min) (ACUTE ONLY): 25 min  Charges:  $Gait Training: 8-22 mins $Therapeutic Activity: 8-22 mins  G Codes:      Rica Koyanagi  PTA [facility]  Acute  Rehab Pager      [phone]

## 2016-08-30 NOTE — Progress Notes (Signed)
   Subjective: 2 Days Post-Op Procedure(s) (LRB): RIGHT TOTAL KNEE ARTHROPLASTY (Right) Patient reports pain as mild.   Patient seen in rounds by Dr. Wynelle Link. Patient is well, and has had no acute complaints or problems Patient is ready to go home  Objective: Vital signs in last 24 hours: Temp:  [97.9 F (36.6 C)-99.1 F (37.3 C)] 99.1 F (37.3 C) (09/13 0550) Pulse Rate:  [72-84] 74 (09/13 0550) Resp:  [18-20] 18 (09/13 0550) BP: (131-156)/(60-72) 131/62 (09/13 0550) SpO2:  [98 %-100 %] 98 % (09/13 0550)  Intake/Output from previous day:  Intake/Output Summary (Last 24 hours) at 08/30/16 0722 Last data filed at 08/30/16 0449  Gross per 24 hour  Intake            902.5 ml  Output             3465 ml  Net          -2562.5 ml    Intake/Output this shift: No intake/output data recorded.  Labs:  Recent Labs  08/29/16 0443 08/30/16 0421  HGB 10.9* 11.1*    Recent Labs  08/29/16 0443 08/30/16 0421  WBC 9.6 9.2  RBC 3.62* 3.74*  HCT 31.9* 32.1*  PLT 221 228    Recent Labs  08/29/16 0443 08/30/16 0421  NA 137 138  K 4.1 3.9  CL 106 104  CO2 25 27  BUN 22* 23*  CREATININE 1.32* 1.28*  GLUCOSE 179* 149*  CALCIUM 8.9 9.0   No results for input(s): LABPT, INR in the last 72 hours.  EXAM: General - Patient is Alert, Appropriate and Oriented Extremity - Neurovascular intact Sensation intact distally Incision - clean, dry, no drainage Motor Function - intact, moving foot and toes well on exam.   Assessment/Plan: 2 Days Post-Op Procedure(s) (LRB): RIGHT TOTAL KNEE ARTHROPLASTY (Right) Procedure(s) (LRB): RIGHT TOTAL KNEE ARTHROPLASTY (Right) Past Medical History:  Diagnosis Date  . Diabetes mellitus   . Diabetes mellitus without complication ([place])   . Diverticulitis    Principal Problem:   OA (osteoarthritis) of knee  Estimated body mass index is 29.41 kg/m as calculated from the following:   Height as of this encounter: 5\' 10"  (1.778 m).  Weight as of this encounter: 93 kg (205 lb). Up with therapy Discharge home with home health Diet - Diabetic diet Follow up - in 2 weeks Activity - WBAT Disposition - Home Condition Upon Discharge - Good D/C Meds - See DC Summary DVT Prophylaxis - Xarelto  Arlee Muslim, PA-C Orthopaedic Surgery 08/30/2016, 7:22 AM

## 2016-08-30 NOTE — Progress Notes (Signed)
Occupational Therapy Treatment Patient Details Name: Bradley Gilmore MRN: [id number] DOB: [birth year] Today's Date: 08/30/2016    History of present illness RTKA   OT comments  All education completed and pt questions answered. No further OT needs and pt plans to d/c home today with wife's assistance. Will sign off.   Follow Up Recommendations  No OT follow up;Supervision/Assistance - 24 hour    Equipment Recommendations  None recommended by OT    Recommendations for Other Services      Precautions / Restrictions Precautions Precautions: Fall Required Braces or Orthoses: Knee Immobilizer - Right Knee Immobilizer - Right: Discontinue once straight leg raise with < 10 degree lag Restrictions Weight Bearing Restrictions: No Other Position/Activity Restrictions: WBAT       Mobility Bed Mobility                  Transfers                      Balance                                   ADL Overall ADL's : Needs assistance/impaired                                       General ADL Comments: Patient educated on LB dressing techniques, KI over pants if wearing pants. Wife to assist as needed. Patient reports walking into bathroom and toileting with wife's assistance last night 3 in 1 over toilet and using RW. Reports no difficulty with that task. Demonstrated tub transfer technique to patient with RW and how wife needs to assist him. He verbalized understanding but declined to practice. Patient asked questions about car transfer, assisting with housework, and activity level. Discussed with patient and answered questions. Plan is for d/c home today.      Vision                     Perception     Praxis      Cognition   Behavior During Therapy: WFL for tasks assessed/performed Overall Cognitive Status: Within Functional Limits for tasks assessed                       Extremity/Trunk Assessment               Exercises     Shoulder Instructions       General Comments      Pertinent Vitals/ Pain       Pain Assessment: 0-10 Pain Score: 3  Pain Location: R knee Pain Descriptors / Indicators: Aching;Sore Pain Intervention(s): Monitored during session  Home Living                                          Prior Functioning/Environment              Frequency       Progress Toward Goals  OT Goals(current goals can now be found in the care plan section)  Progress towards OT goals: Goals met/education completed, patient discharged from [facility] All goals met and education completed, patient discharged from OT services  Co-evaluation                 End of Session     Activity Tolerance Patient tolerated treatment well   Patient Left in bed;with call bell/phone within reach   Nurse Communication          Time: 6811-5726 OT Time Calculation (min): 25 min  Charges: OT General Charges $OT Visit: 1 Procedure OT Treatments $Self Care/Home Management : 23-37 mins  Anterrio Mccleery A 08/30/2016, 11:34 AM

## 2016-10-30 ENCOUNTER — Ambulatory Visit (INDEPENDENT_AMBULATORY_CARE_PROVIDER_SITE_OTHER): Payer: 59 | Admitting: Ophthalmology

## 2016-10-30 DIAGNOSIS — H353114 Nonexudative age-related macular degeneration, right eye, advanced atrophic with subfoveal involvement: Secondary | ICD-10-CM

## 2016-10-30 DIAGNOSIS — H35371 Puckering of macula, right eye: Secondary | ICD-10-CM

## 2016-10-30 DIAGNOSIS — E113293 Type 2 diabetes mellitus with mild nonproliferative diabetic retinopathy without macular edema, bilateral: Secondary | ICD-10-CM

## 2016-10-30 DIAGNOSIS — H43813 Vitreous degeneration, bilateral: Secondary | ICD-10-CM | POA: Diagnosis not present

## 2016-10-30 DIAGNOSIS — E11319 Type 2 diabetes mellitus with unspecified diabetic retinopathy without macular edema: Secondary | ICD-10-CM

## 2016-10-30 DIAGNOSIS — H353121 Nonexudative age-related macular degeneration, left eye, early dry stage: Secondary | ICD-10-CM | POA: Diagnosis not present

## 2016-11-21 ENCOUNTER — Ambulatory Visit (INDEPENDENT_AMBULATORY_CARE_PROVIDER_SITE_OTHER): Payer: 59 | Admitting: Podiatry

## 2016-11-21 ENCOUNTER — Encounter: Payer: Self-pay | Admitting: Podiatry

## 2016-11-21 DIAGNOSIS — M79676 Pain in unspecified toe(s): Secondary | ICD-10-CM | POA: Diagnosis not present

## 2016-11-21 DIAGNOSIS — B351 Tinea unguium: Secondary | ICD-10-CM | POA: Diagnosis not present

## 2016-11-21 DIAGNOSIS — Q828 Other specified congenital malformations of skin: Secondary | ICD-10-CM

## 2016-11-21 DIAGNOSIS — E11319 Type 2 diabetes mellitus with unspecified diabetic retinopathy without macular edema: Secondary | ICD-10-CM

## 2016-11-22 NOTE — Progress Notes (Signed)
He presents today for chief complaint of painful toenails and calluses bilateral.  Objective: Vital signs are stable he is alert and oriented x3. Pulses are palpable. His toenails are thick yellow dystrophic clinically mycotic. Reactive hyperkeratosis antar aspect of the forefoot bilateral.  Assessment: Pain in limb secondary to onychomycosis porokeratosis.  Plan: Debridement and toenails one through 5 bilateral. Debridement of all reactive hyperkeratosis bilateral. Followup with him in 3 months.

## 2017-02-20 ENCOUNTER — Encounter: Payer: Self-pay | Admitting: Podiatry

## 2017-02-20 ENCOUNTER — Ambulatory Visit (INDEPENDENT_AMBULATORY_CARE_PROVIDER_SITE_OTHER): Payer: No Typology Code available for payment source | Admitting: Podiatry

## 2017-02-20 DIAGNOSIS — M79676 Pain in unspecified toe(s): Secondary | ICD-10-CM | POA: Diagnosis not present

## 2017-02-20 DIAGNOSIS — B351 Tinea unguium: Secondary | ICD-10-CM

## 2017-02-20 DIAGNOSIS — Q828 Other specified congenital malformations of skin: Secondary | ICD-10-CM

## 2017-02-20 NOTE — Progress Notes (Signed)
He presents today chief complaint of painful elongated toenails.  Objective: Toenails are long thick yellow dystrophic with mycotic painful palpation. Pulses are palpable no open lesions or wounds are noted.  Assessment: Pain in limb seen in onychomycosis.  Plan: Debridement toenails 1 through 5 bilateral.

## 2017-03-26 ENCOUNTER — Ambulatory Visit
Admission: RE | Admit: 2017-03-26 | Discharge: 2017-03-26 | Disposition: A | Discharge status: Home / Self Care | Payer: No Typology Code available for payment source | Source: Ambulatory Visit | Attending: Internal Medicine | Admitting: Internal Medicine

## 2017-03-26 ENCOUNTER — Other Ambulatory Visit: Payer: Self-pay | Admitting: Internal Medicine

## 2017-03-26 DIAGNOSIS — M546 Pain in thoracic spine: Secondary | ICD-10-CM

## 2017-05-22 ENCOUNTER — Encounter: Payer: Self-pay | Admitting: Podiatry

## 2017-05-22 ENCOUNTER — Ambulatory Visit (INDEPENDENT_AMBULATORY_CARE_PROVIDER_SITE_OTHER): Payer: No Typology Code available for payment source | Admitting: Podiatry

## 2017-05-22 DIAGNOSIS — M79676 Pain in unspecified toe(s): Secondary | ICD-10-CM

## 2017-05-22 DIAGNOSIS — E119 Type 2 diabetes mellitus without complications: Secondary | ICD-10-CM | POA: Diagnosis not present

## 2017-05-22 DIAGNOSIS — B351 Tinea unguium: Secondary | ICD-10-CM | POA: Diagnosis not present

## 2017-05-22 DIAGNOSIS — Q828 Other specified congenital malformations of skin: Secondary | ICD-10-CM | POA: Diagnosis not present

## 2017-05-22 NOTE — Progress Notes (Signed)
He presents today as a diabetic with a chief concern of painful elongated toenails and multiple calluses. He would like to have them trimmed.  Objective: Vital signs are stable he is alert and oriented 3 pulses are palpable. Nail are elongated yellow dystrophic onychomycotic he also has calluses at the plantar aspect of the bilateral foot. No open lesions or wounds are noted.  Assessment: Diabetes mellitus without complications. Pain in limb secondary to onychomycosis and porokeratosis.  Plan: Debridement of toenails and porokeratotic lesions bilaterally. Follow up with him in 3 months

## 2017-07-19 ENCOUNTER — Encounter: Payer: Self-pay | Admitting: Podiatry

## 2017-07-19 ENCOUNTER — Ambulatory Visit (INDEPENDENT_AMBULATORY_CARE_PROVIDER_SITE_OTHER): Payer: No Typology Code available for payment source | Admitting: Podiatry

## 2017-07-19 ENCOUNTER — Ambulatory Visit (INDEPENDENT_AMBULATORY_CARE_PROVIDER_SITE_OTHER): Payer: No Typology Code available for payment source

## 2017-07-19 DIAGNOSIS — M779 Enthesopathy, unspecified: Secondary | ICD-10-CM

## 2017-07-19 DIAGNOSIS — M2041 Other hammer toe(s) (acquired), right foot: Secondary | ICD-10-CM

## 2017-07-19 DIAGNOSIS — E119 Type 2 diabetes mellitus without complications: Secondary | ICD-10-CM | POA: Diagnosis not present

## 2017-07-19 DIAGNOSIS — M2042 Other hammer toe(s) (acquired), left foot: Secondary | ICD-10-CM | POA: Diagnosis not present

## 2017-07-19 NOTE — Progress Notes (Signed)
He presents today for follow-up of his plantar forefoot pain states is bothering him around the first and second and third toes of the right foot states that but having a lot of pain in this foot.  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 Pulses are strongly palpable no open lesions or wounds are noted radiographs do demonstrate hammertoe deformity elongated second metatarsal which appears to be plantar flexed and bunion deformity.  Assessment: Pain in limb secondary to capsulitis second metatarsophalangeal joint with hammertoe deformity.  Plan: I injected around the second metatarsophalangeal joint. Kenalog and local anesthetic was utilized. The trash

## 2017-07-31 ENCOUNTER — Ambulatory Visit (INDEPENDENT_AMBULATORY_CARE_PROVIDER_SITE_OTHER): Payer: 59 | Admitting: Ophthalmology

## 2017-08-23 ENCOUNTER — Encounter: Payer: Self-pay | Admitting: Podiatry

## 2017-08-23 ENCOUNTER — Ambulatory Visit (INDEPENDENT_AMBULATORY_CARE_PROVIDER_SITE_OTHER): Payer: No Typology Code available for payment source | Admitting: Podiatry

## 2017-08-23 DIAGNOSIS — M79676 Pain in unspecified toe(s): Secondary | ICD-10-CM

## 2017-08-23 DIAGNOSIS — B351 Tinea unguium: Secondary | ICD-10-CM

## 2017-08-23 DIAGNOSIS — M779 Enthesopathy, unspecified: Secondary | ICD-10-CM | POA: Diagnosis not present

## 2017-08-23 NOTE — Progress Notes (Signed)
He presents today chief complaint of pain to the right second metatarsophalangeal joint and elongated painful toenails.  Objective: Vital signs are stable he's alert and oriented 3. Pulses are palpable. Some tenderness on palpation and range of motion of the second metatarsophalangeal joint. His toenails are long thick yellow dystrophic onychomycotic painful on palpation as well as debridement.  Assessment: Pain in limb secondary to onychomycosis and capsulitis second metatarsophalangeal joint of the right foot.  Plan: Reinjected the second metatarsophalangeal joint today with a local anesthetic at the level of the PIPJ as well. Hammertoe deformity is increasingly more painful. I also debrided his toenails 1 through 5 bilateral.

## 2017-08-27 ENCOUNTER — Other Ambulatory Visit: Payer: Self-pay | Admitting: Internal Medicine

## 2017-08-27 DIAGNOSIS — M542 Cervicalgia: Secondary | ICD-10-CM

## 2017-08-27 DIAGNOSIS — R519 Headache, unspecified: Secondary | ICD-10-CM

## 2017-08-27 DIAGNOSIS — R51 Headache: Secondary | ICD-10-CM

## 2017-08-28 ENCOUNTER — Other Ambulatory Visit: Payer: Self-pay | Admitting: Internal Medicine

## 2017-08-28 DIAGNOSIS — R51 Headache: Principal | ICD-10-CM

## 2017-08-28 DIAGNOSIS — R519 Headache, unspecified: Secondary | ICD-10-CM

## 2017-08-29 ENCOUNTER — Ambulatory Visit
Admission: RE | Admit: 2017-08-29 | Discharge: 2017-08-29 | Disposition: A | Discharge status: Home / Self Care | Payer: 59 | Source: Ambulatory Visit | Attending: Internal Medicine | Admitting: Internal Medicine

## 2017-08-29 DIAGNOSIS — R519 Headache, unspecified: Secondary | ICD-10-CM

## 2017-08-29 DIAGNOSIS — R51 Headache: Principal | ICD-10-CM

## 2017-08-29 MED ORDER — GADOBENATE DIMEGLUMINE 529 MG/ML IV SOLN
19.0000 mL | Freq: Once | INTRAVENOUS | Status: AC | PRN
  Administered 2017-08-29: 19 mL via INTRAVENOUS

## 2017-08-30 ENCOUNTER — Other Ambulatory Visit: Payer: 59

## 2017-09-03 ENCOUNTER — Other Ambulatory Visit: Payer: 59

## 2017-09-10 ENCOUNTER — Ambulatory Visit
Admission: RE | Admit: 2017-09-10 | Discharge: 2017-09-10 | Disposition: A | Discharge status: Home / Self Care | Payer: 59 | Source: Ambulatory Visit | Attending: Otolaryngology | Admitting: Otolaryngology

## 2017-09-10 ENCOUNTER — Encounter (INDEPENDENT_AMBULATORY_CARE_PROVIDER_SITE_OTHER): Payer: No Typology Code available for payment source | Admitting: Ophthalmology

## 2017-09-10 ENCOUNTER — Other Ambulatory Visit: Payer: Self-pay | Admitting: Otolaryngology

## 2017-09-10 DIAGNOSIS — J0140 Acute pansinusitis, unspecified: Secondary | ICD-10-CM

## 2017-09-12 ENCOUNTER — Other Ambulatory Visit: Payer: Self-pay | Admitting: Otolaryngology

## 2017-09-17 DIAGNOSIS — H532 Diplopia: Secondary | ICD-10-CM | POA: Insufficient documentation

## 2017-09-19 ENCOUNTER — Encounter (INDEPENDENT_AMBULATORY_CARE_PROVIDER_SITE_OTHER): Payer: No Typology Code available for payment source | Admitting: Ophthalmology

## 2017-10-10 ENCOUNTER — Encounter (INDEPENDENT_AMBULATORY_CARE_PROVIDER_SITE_OTHER): Payer: Medicare Other | Admitting: Ophthalmology

## 2017-10-10 DIAGNOSIS — H353121 Nonexudative age-related macular degeneration, left eye, early dry stage: Secondary | ICD-10-CM | POA: Diagnosis not present

## 2017-10-10 DIAGNOSIS — E113293 Type 2 diabetes mellitus with mild nonproliferative diabetic retinopathy without macular edema, bilateral: Secondary | ICD-10-CM | POA: Diagnosis not present

## 2017-10-10 DIAGNOSIS — H43813 Vitreous degeneration, bilateral: Secondary | ICD-10-CM

## 2017-10-10 DIAGNOSIS — H353112 Nonexudative age-related macular degeneration, right eye, intermediate dry stage: Secondary | ICD-10-CM | POA: Diagnosis not present

## 2017-10-10 DIAGNOSIS — E11319 Type 2 diabetes mellitus with unspecified diabetic retinopathy without macular edema: Secondary | ICD-10-CM | POA: Diagnosis not present

## 2017-11-22 ENCOUNTER — Ambulatory Visit (INDEPENDENT_AMBULATORY_CARE_PROVIDER_SITE_OTHER): Payer: Medicare Other | Admitting: Podiatry

## 2017-11-22 ENCOUNTER — Encounter: Payer: Self-pay | Admitting: Podiatry

## 2017-11-22 DIAGNOSIS — B351 Tinea unguium: Secondary | ICD-10-CM

## 2017-11-22 DIAGNOSIS — M79676 Pain in unspecified toe(s): Secondary | ICD-10-CM

## 2017-11-22 NOTE — Progress Notes (Signed)
He presents today with chief complaint of painful elongated toenails 1 through 5 bilaterally.  Objective: Vital signs are stable he is alert and oriented x3.  Pulses are palpable.  Neurologic sensorium is intact.  Still has pain to his toes secondary to osteoarthritis.  Assessment: Pain in limb secondary to onychomycosis 1 through 5 bilaterally.  Plan: Debridement of toenails 1 through 5 bilateral, service secondary to pain.  Follow-up with him in 3 months

## 2018-01-01 DIAGNOSIS — Z96651 Presence of right artificial knee joint: Secondary | ICD-10-CM | POA: Insufficient documentation

## 2018-02-26 ENCOUNTER — Encounter: Payer: Self-pay | Admitting: Podiatry

## 2018-02-26 ENCOUNTER — Ambulatory Visit (INDEPENDENT_AMBULATORY_CARE_PROVIDER_SITE_OTHER): Payer: Medicare Other | Admitting: Podiatry

## 2018-02-26 DIAGNOSIS — B351 Tinea unguium: Secondary | ICD-10-CM | POA: Diagnosis not present

## 2018-02-26 DIAGNOSIS — M79676 Pain in unspecified toe(s): Secondary | ICD-10-CM | POA: Diagnosis not present

## 2018-02-26 NOTE — Progress Notes (Signed)
He presents today chief complaint of painful elongated toenails 1 through 5 bilateral.  Objective: Vital signs are stable he is alert and oriented x3 toenails are long thick yellow dystrophic clinically mycotic painful palpation as well as debridement.  Assessment: Pain in limb secondary to onychomycosis.  Plan: Debridement of onychomycosis bilateral foot.

## 2018-05-28 ENCOUNTER — Ambulatory Visit (INDEPENDENT_AMBULATORY_CARE_PROVIDER_SITE_OTHER): Payer: Medicare Other | Admitting: Podiatry

## 2018-05-28 ENCOUNTER — Encounter: Payer: Self-pay | Admitting: Podiatry

## 2018-05-28 DIAGNOSIS — M79676 Pain in unspecified toe(s): Secondary | ICD-10-CM

## 2018-05-28 DIAGNOSIS — E119 Type 2 diabetes mellitus without complications: Secondary | ICD-10-CM | POA: Diagnosis not present

## 2018-05-28 DIAGNOSIS — B351 Tinea unguium: Secondary | ICD-10-CM

## 2018-05-28 NOTE — Progress Notes (Signed)
He presents today chief complaint of painful elongated toenails 1 through 5 bilateral.  Objective: Vital signs are stable he is alert and oriented x3.  Pulses are palpable.  Neurologic sensorium is intact.  Toenails are long thick yellow dystrophic-like mycotic painful palpation.  Assessment: Pain in limb secondary to onychomycosis.  Plan: Treatment of toenails 1 through 5 bilateral.

## 2018-07-10 ENCOUNTER — Encounter (INDEPENDENT_AMBULATORY_CARE_PROVIDER_SITE_OTHER): Payer: Medicare Other | Admitting: Ophthalmology

## 2018-07-10 DIAGNOSIS — E113292 Type 2 diabetes mellitus with mild nonproliferative diabetic retinopathy without macular edema, left eye: Secondary | ICD-10-CM

## 2018-07-10 DIAGNOSIS — E11319 Type 2 diabetes mellitus with unspecified diabetic retinopathy without macular edema: Secondary | ICD-10-CM

## 2018-07-10 DIAGNOSIS — H43813 Vitreous degeneration, bilateral: Secondary | ICD-10-CM

## 2018-07-10 DIAGNOSIS — H353112 Nonexudative age-related macular degeneration, right eye, intermediate dry stage: Secondary | ICD-10-CM | POA: Diagnosis not present

## 2018-08-29 ENCOUNTER — Encounter: Payer: Self-pay | Admitting: Podiatry

## 2018-08-29 ENCOUNTER — Ambulatory Visit (INDEPENDENT_AMBULATORY_CARE_PROVIDER_SITE_OTHER): Payer: Medicare Other | Admitting: Podiatry

## 2018-08-29 DIAGNOSIS — E119 Type 2 diabetes mellitus without complications: Secondary | ICD-10-CM | POA: Diagnosis not present

## 2018-08-29 DIAGNOSIS — M79676 Pain in unspecified toe(s): Secondary | ICD-10-CM | POA: Diagnosis not present

## 2018-08-29 DIAGNOSIS — B351 Tinea unguium: Secondary | ICD-10-CM

## 2018-08-29 DIAGNOSIS — D689 Coagulation defect, unspecified: Secondary | ICD-10-CM

## 2018-08-29 NOTE — Progress Notes (Signed)
He presents today chief complaint of painful elongated toenails.  Objective: Toenails are long thick yellow dystrophic-like mycotic no open lesions or wounds.  Assessment: Pain in limb secondary to onychomycosis.  Plan: Debridement of toenails 1 through 5 bilateral.

## 2018-09-26 ENCOUNTER — Ambulatory Visit (INDEPENDENT_AMBULATORY_CARE_PROVIDER_SITE_OTHER): Payer: Medicare Other | Admitting: Podiatry

## 2018-09-26 ENCOUNTER — Encounter: Payer: Self-pay | Admitting: Podiatry

## 2018-09-26 DIAGNOSIS — M778 Other enthesopathies, not elsewhere classified: Secondary | ICD-10-CM

## 2018-09-26 DIAGNOSIS — M779 Enthesopathy, unspecified: Secondary | ICD-10-CM | POA: Diagnosis not present

## 2018-09-26 NOTE — Progress Notes (Signed)
He presents today for follow-up of capsulitis to the second metatarsophalangeal joint of the right foot.  On exam he states that he did not think he needed injections now he states I think I need a shot it is gotten worse over the past few days.  Objective: Vital signs are stable alert and oriented x3.  Pulses are palpable.  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  Assessment: Capsulitis of the second metatarsal phalangeal joint right foot.  Plan: After sterile Betadine skin prep I injected 10 mg Kenalog 5 mg Marcaine point maximal tenderness around the joint.  Tolerated procedure well without complications.

## 2018-11-28 ENCOUNTER — Ambulatory Visit: Payer: Medicare Other | Admitting: Podiatry

## 2018-12-26 ENCOUNTER — Encounter: Payer: Self-pay | Admitting: Podiatry

## 2018-12-26 ENCOUNTER — Ambulatory Visit (INDEPENDENT_AMBULATORY_CARE_PROVIDER_SITE_OTHER): Payer: Medicare Other | Admitting: Podiatry

## 2018-12-26 DIAGNOSIS — B351 Tinea unguium: Secondary | ICD-10-CM | POA: Diagnosis not present

## 2018-12-26 DIAGNOSIS — M79676 Pain in unspecified toe(s): Secondary | ICD-10-CM

## 2018-12-26 DIAGNOSIS — E119 Type 2 diabetes mellitus without complications: Secondary | ICD-10-CM

## 2018-12-26 DIAGNOSIS — D689 Coagulation defect, unspecified: Secondary | ICD-10-CM | POA: Diagnosis not present

## 2018-12-26 NOTE — Progress Notes (Signed)
He presents today chief complaint of painful elongated toenails 1 through 5 bilateral.  Objective: Toenails are long thick yellow dystrophic-like mycotic no open lesions or wounds.  Pulses remain palpable.  Assessment: Pain in limb secondary to onychomycosis.  Plan: Debridement of toenails 1 through 5 bilateral covered service secondary to pain.

## 2019-02-18 ENCOUNTER — Ambulatory Visit (INDEPENDENT_AMBULATORY_CARE_PROVIDER_SITE_OTHER): Payer: Medicare Other | Admitting: Podiatry

## 2019-02-18 ENCOUNTER — Encounter: Payer: Self-pay | Admitting: Podiatry

## 2019-02-18 DIAGNOSIS — M778 Other enthesopathies, not elsewhere classified: Secondary | ICD-10-CM

## 2019-02-18 DIAGNOSIS — M779 Enthesopathy, unspecified: Secondary | ICD-10-CM | POA: Diagnosis not present

## 2019-02-18 NOTE — Progress Notes (Signed)
He presents today chief complaint of painful first metatarsal phalangeal joint of the right foot.  States that is been painful for quite some time.  Seems to be getting worse.  Objective: Vital signs are stable he is alert and oriented x3 severe bunion deformity with limitation of range of motion of the first metatarsophalangeal joint particularly dorsiflexion is creating pain in the joint as well as stress risers of the proximal phalanx and the first met.  Assessment: Capsulitis with bunion deformity hallux limitus.  Plan: Vital signs are stable alert and oriented x3 extubated skin prep injected 10 mg of Kenalog 5 mg Marcaine after sterile Betadine skin prep.  Tolerated procedure well without complications.  Follow-up with him on an as-needed basis did discuss the possible need for surgery.

## 2019-03-25 ENCOUNTER — Encounter: Payer: Self-pay | Admitting: Podiatry

## 2019-03-25 ENCOUNTER — Ambulatory Visit (INDEPENDENT_AMBULATORY_CARE_PROVIDER_SITE_OTHER): Payer: No Typology Code available for payment source | Admitting: Podiatry

## 2019-03-25 ENCOUNTER — Other Ambulatory Visit: Payer: Self-pay

## 2019-03-25 VITALS — Temp 98.2°F

## 2019-03-25 DIAGNOSIS — E119 Type 2 diabetes mellitus without complications: Secondary | ICD-10-CM

## 2019-03-25 DIAGNOSIS — B351 Tinea unguium: Secondary | ICD-10-CM | POA: Diagnosis not present

## 2019-03-25 DIAGNOSIS — M779 Enthesopathy, unspecified: Secondary | ICD-10-CM

## 2019-03-25 DIAGNOSIS — M7751 Other enthesopathy of right foot: Secondary | ICD-10-CM

## 2019-03-25 DIAGNOSIS — D689 Coagulation defect, unspecified: Secondary | ICD-10-CM

## 2019-03-25 DIAGNOSIS — M79676 Pain in unspecified toe(s): Secondary | ICD-10-CM | POA: Diagnosis not present

## 2019-03-25 DIAGNOSIS — M778 Other enthesopathies, not elsewhere classified: Secondary | ICD-10-CM

## 2019-03-25 NOTE — Progress Notes (Signed)
He presents today for follow-up of capsulitis second metatarsal phalangeal joint of the right foot as well as painful mycotic nails.  Objective: Toenails are long thick yellow dystrophic-like mycotic hammertoe deformity second right with bunion deformity right foot resulting in pain on palpation and range of motion.  Assessment: Capsulitis second metatarsal phalangeal joint right foot.  Painful mycotic nails 1 through 5 bilaterally.  Plan: Debridement of toenails 1 through 5 bilateral injected around the second metatarsal phalangeal joint with 10 mg Kenalog 5 mg Marcaine for maximal tenderness.  Follow-up with him on a as needed basis or in 2 months for nail debridement recommended no further injections for at least 4 months.

## 2019-03-27 ENCOUNTER — Ambulatory Visit: Payer: Medicare Other | Admitting: Podiatry

## 2019-04-16 ENCOUNTER — Encounter (INDEPENDENT_AMBULATORY_CARE_PROVIDER_SITE_OTHER): Payer: Medicare Other | Admitting: Ophthalmology

## 2019-05-02 ENCOUNTER — Other Ambulatory Visit: Payer: Self-pay

## 2019-05-02 ENCOUNTER — Ambulatory Visit (INDEPENDENT_AMBULATORY_CARE_PROVIDER_SITE_OTHER): Payer: Medicare Other | Admitting: Podiatry

## 2019-05-02 ENCOUNTER — Other Ambulatory Visit: Payer: Self-pay | Admitting: Podiatry

## 2019-05-02 ENCOUNTER — Ambulatory Visit (INDEPENDENT_AMBULATORY_CARE_PROVIDER_SITE_OTHER): Payer: Medicare Other

## 2019-05-02 VITALS — Temp 98.2°F

## 2019-05-02 DIAGNOSIS — M2011 Hallux valgus (acquired), right foot: Secondary | ICD-10-CM

## 2019-05-02 DIAGNOSIS — M2041 Other hammer toe(s) (acquired), right foot: Secondary | ICD-10-CM | POA: Diagnosis not present

## 2019-05-02 DIAGNOSIS — E1151 Type 2 diabetes mellitus with diabetic peripheral angiopathy without gangrene: Secondary | ICD-10-CM | POA: Diagnosis not present

## 2019-05-02 DIAGNOSIS — M2042 Other hammer toe(s) (acquired), left foot: Secondary | ICD-10-CM

## 2019-05-02 DIAGNOSIS — M79671 Pain in right foot: Secondary | ICD-10-CM

## 2019-05-02 DIAGNOSIS — M779 Enthesopathy, unspecified: Secondary | ICD-10-CM | POA: Diagnosis not present

## 2019-05-02 NOTE — Progress Notes (Signed)
Subjective:  Patient ID: Bradley Gilmore, male    DOB: May 10, 1946,  MRN: [id number]  Chief Complaint  Patient presents with   Foot Pain    Pt states right medial foot pain proximal 1st MT. Pt states it is not healing.     73 y.o. male presents with the above complaint. Hx above confirmed with patient. States he has had the bunion for a long time but never had an issue like this before.   Review of Systems: Negative except as noted in the HPI. Denies N/V/F/Ch.  Past Medical History:  Diagnosis Date   Diabetes mellitus    Diabetes mellitus without complication (HCC)    Diverticulitis     Current Outpatient Medications:    brimonidine (ALPHAGAN) 0.2 % ophthalmic solution, PLACE 1 DROP INTO LEFT EYE TWICE A DAY, Disp: , Rfl: 4   erythromycin ophthalmic ointment, APPLY A SMALL AMOUNT INTO BOTH EYES AT BEDTIME, Disp: , Rfl: 2   etodolac (LODINE) 500 MG tablet, TAKE 1 TABLET BY MOUTH TWICE A DAY AFTER A MEAL, Disp: , Rfl: 5   fluorouracil (EFUDEX) 5 % cream, APPLY TO AFFECTED AREA EVERY DAY AT BEDTIME, Disp: , Rfl:    glimepiride (AMARYL) 4 MG tablet, Take 4 mg by mouth daily before breakfast., Disp: , Rfl:    metFORMIN (GLUCOPHAGE-XR) 500 MG 24 hr tablet, , Disp: , Rfl:    methocarbamol (ROBAXIN) 500 MG tablet, Take 1 tablet (500 mg total) by mouth every 6 (six) hours as needed for muscle spasms., Disp: 80 tablet, Rfl: 0   omeprazole (PRILOSEC) 20 MG capsule, Take 20 mg by mouth daily., Disp: , Rfl:    ONE TOUCH ULTRA TEST test strip, CHECK PATIENTS BLOOD GLUCOSE ONCE DAILY, ALTERNATING BETWEEN MORNING AND EVENING MEAL (E11.9), Disp: , Rfl:    OZEMPIC 0.25 or 0.5 MG/DOSE SOPN, INJECT 0.5MG  SUBCUTANEOUSLY EVERY WEEK AS DIRECTED, Disp: , Rfl: 5   rivaroxaban (XARELTO) 10 MG TABS tablet, Take 1 tablet (10 mg total) by mouth daily with breakfast. Take Xarelto for two and a half more weeks, then discontinue Xarelto. Once the patient has completed the blood thinner regimen, then  take a Baby 81 mg Aspirin daily for three more weeks., Disp: 19 tablet, Rfl: 0   simvastatin (ZOCOR) 20 MG tablet, Take 20 mg by mouth daily., Disp: , Rfl:    Tamsulosin HCl (FLOMAX) 0.4 MG CAPS, Take 1 capsule (0.4 mg total) by mouth daily after supper., Disp: 30 capsule, Rfl: 0   tobramycin (TOBREX) 0.3 % ophthalmic solution, INSTILL 2 DROPS INTO AFFECTED EYE QID FOR 5 DAYS, Disp: , Rfl: 1   traMADol (ULTRAM) 50 MG tablet, Take 50-100 mg by mouth every 6 (six) hours as needed. for pain, Disp: , Rfl:    TRULICITY 2.58 NI/7.7OE SOPN, INJECT 1 PEN SUBCUTANEOUSLY ONCE WEEKLY, Disp: , Rfl: 3  Social History   Tobacco Use  Smoking Status Never Smoker  Smokeless Tobacco Never Used    No Known Allergies Objective:   Vitals:   05/02/19 0859  Temp: 98.2 F (36.8 C)   There is no height or weight on file to calculate BMI. Constitutional Well developed. Well nourished.  Vascular Dorsalis pedis pulses palpable bilaterally. Posterior tibial pulses palpable bilaterally. Capillary refill normal to all digits.  No cyanosis or clubbing noted. Pedal hair growth normal.  Neurologic Normal speech. Oriented to person, place, and time. Epicritic sensation to light touch grossly present bilaterally.  Dermatologic Nails well groomed and normal in appearance. No  open wound but skin irritation noted medial 1st MPJ R  Orthopedic: Hallux valgus bilat, hammertoes bilat.   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 Assessment:   1. Pain in right foot    Plan:  Patient was evaluated and treated and all questions answered.  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

## 2019-05-21 IMAGING — CT CT MAXILLOFACIAL W/O CM
1 series · 15 of 30 positions shown, 19 images · non-contrast
Comparison: Limited sinus CT 12/08/2011

CLINICAL DATA: Chronic sphenoid sinusitis. Right temporal and right
eye pain.

EXAM:
CT MAXILLOFACIAL WITHOUT CONTRAST
TECHNIQUE: Multidetector CT images of the paranasal sinuses were obtained using
the standard protocol without intravenous contrast.

[Series 4: maxofacial soft · axial · 0.39mm/px · z∈[-204,-94]mm · 15 of 41 slices shown, 19 images]
[im 2/41  brain]
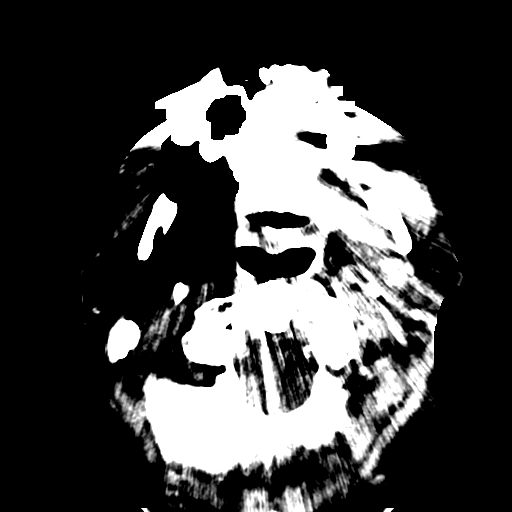
[im 2/41  bone]
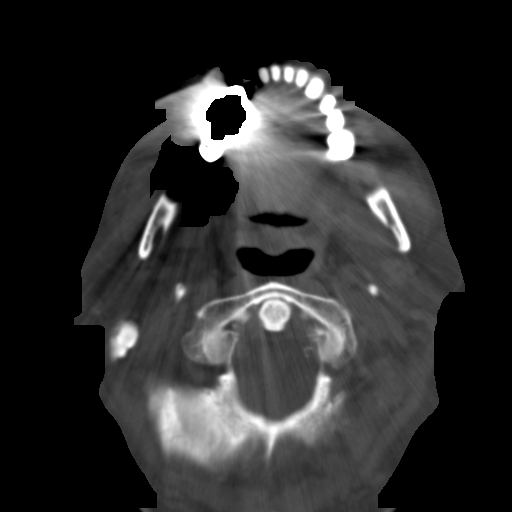
[im 5/41  bone]
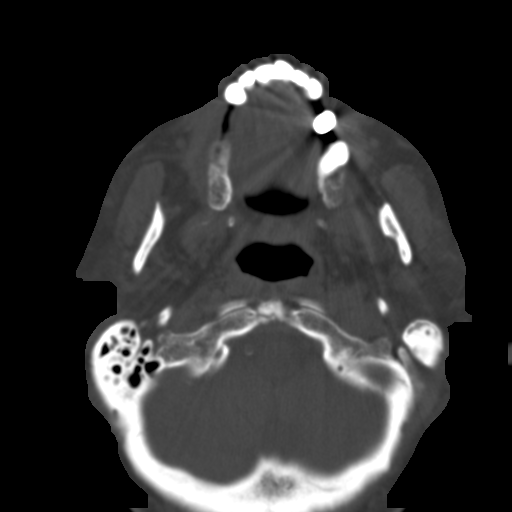
[im 7/41  bone]
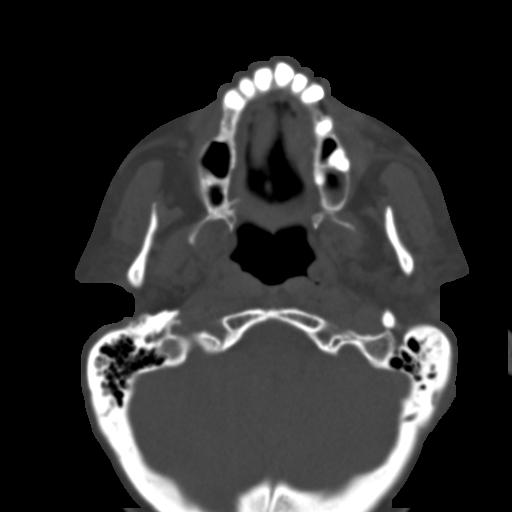
[im 10/41  bone]
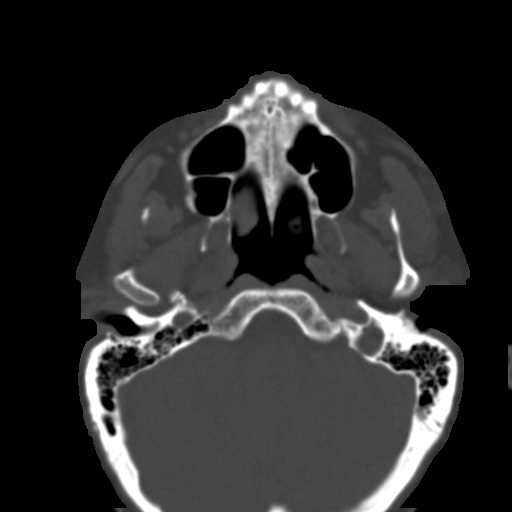
[im 13/41  brain]
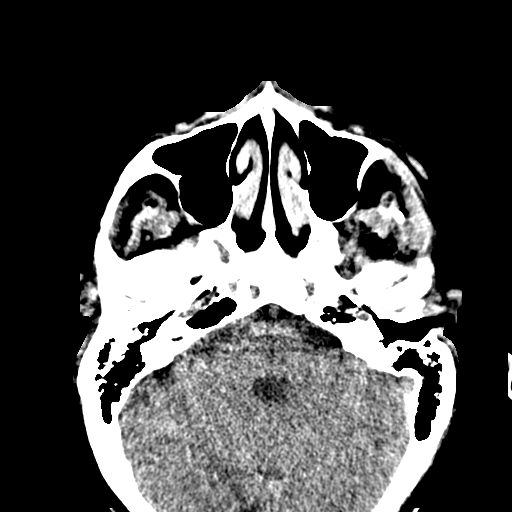
[im 13/41  bone]
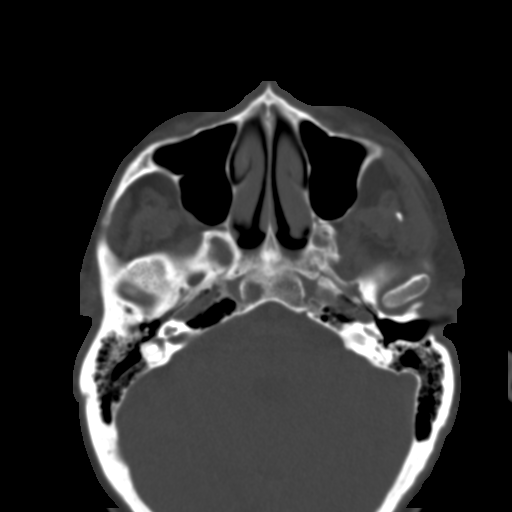
[im 16/41  bone]
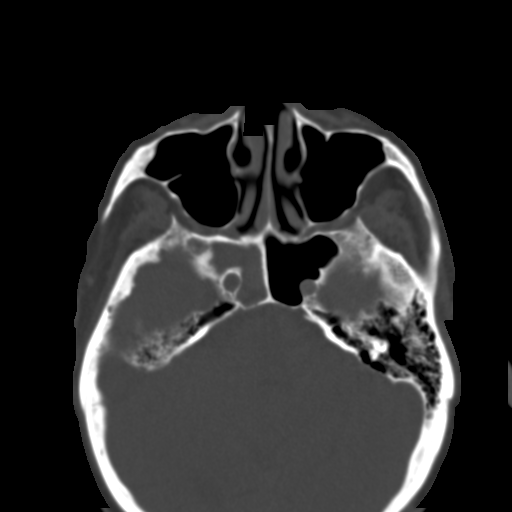
[im 18/41  bone]
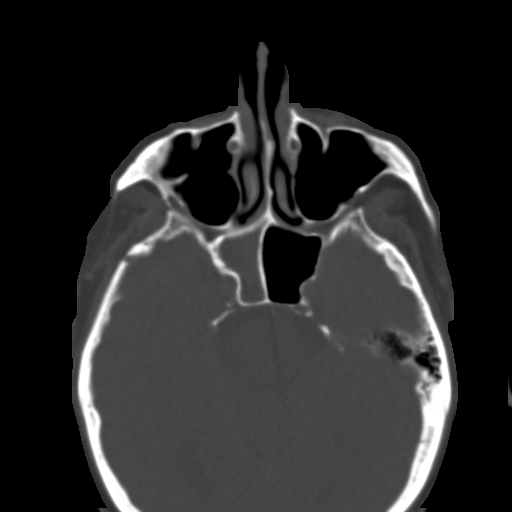
[im 21/41  bone]
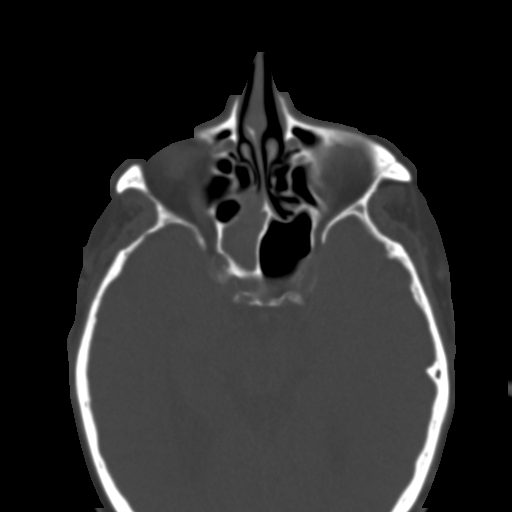
[im 23/41  brain]
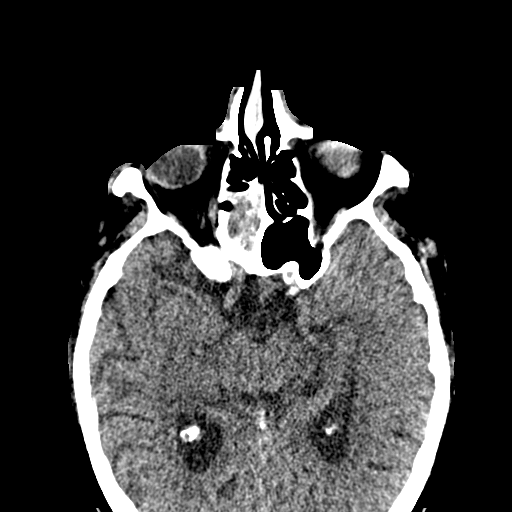
[im 23/41  bone]
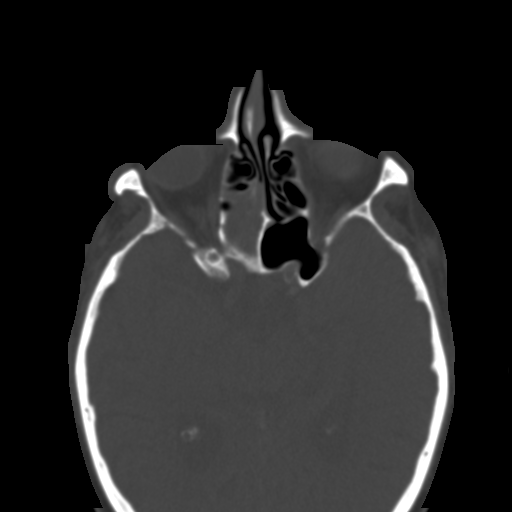
[im 25/41  bone]
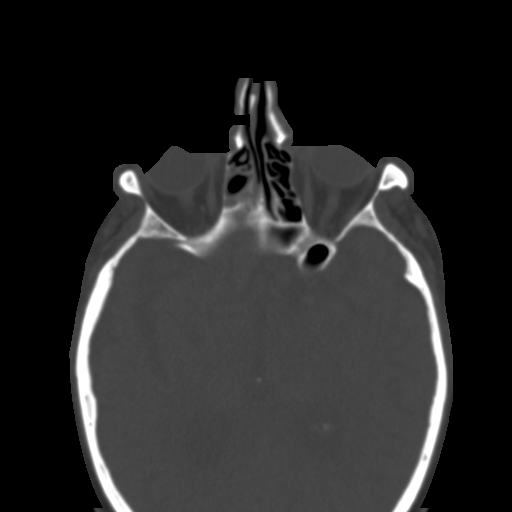
[im 28/41  bone]
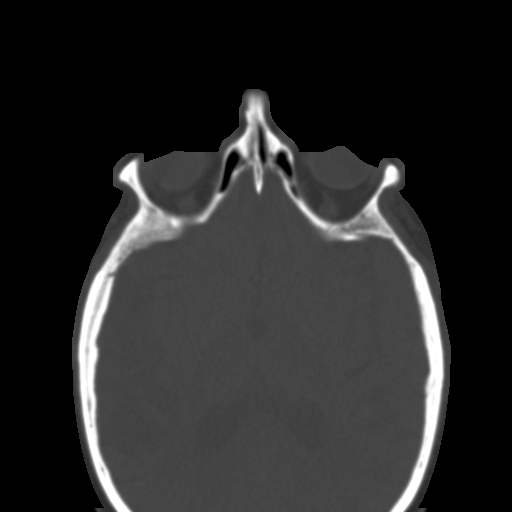
[im 31/41  bone]
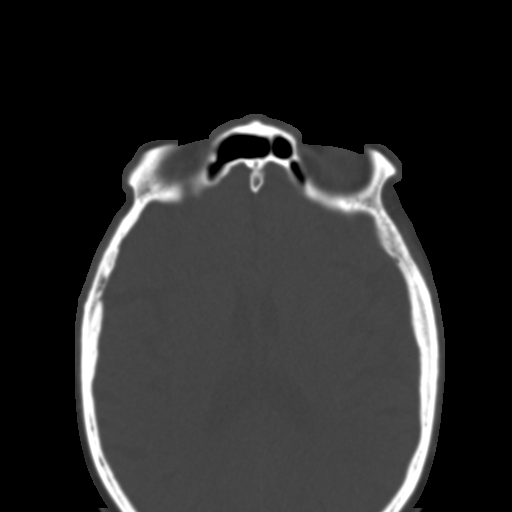
[im 34/41  brain]
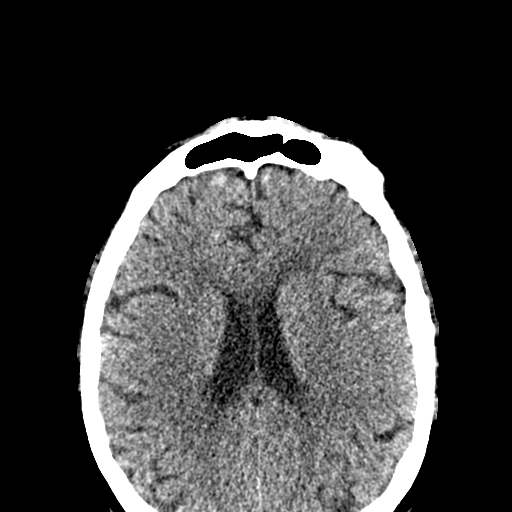
[im 34/41  bone]
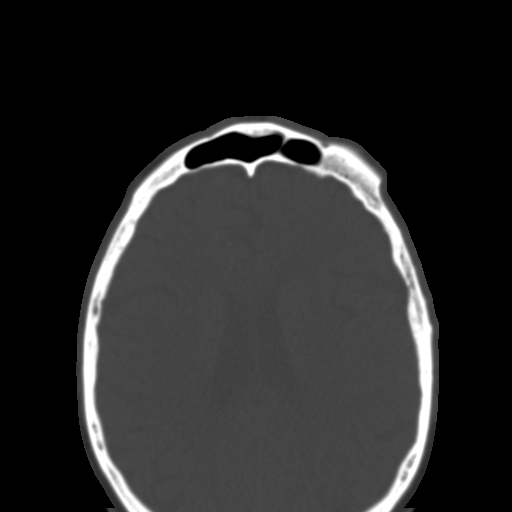
[im 36/41  bone]
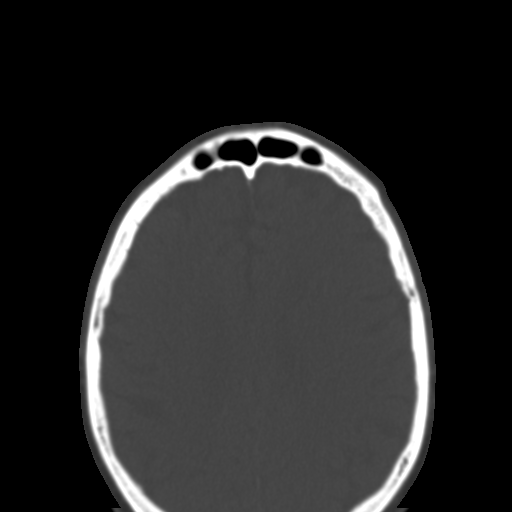
[im 39/41  bone]
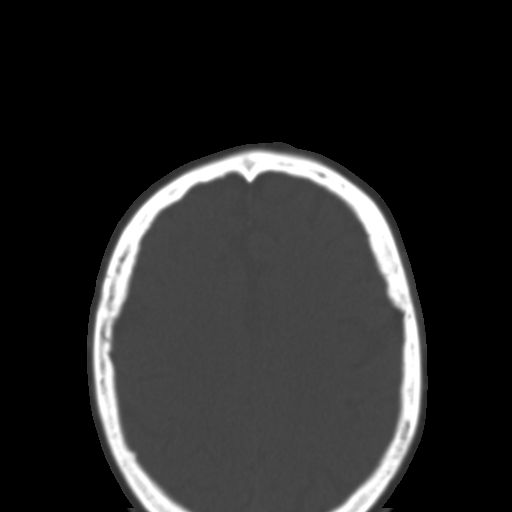

[15 of 30 positions shown; findings below may reference images not displayed]

FINDINGS: Paranasal sinuses:

Frontal: Normally aerated. Patent frontal sinus drainage pathways.

Ethmoid: There is opacification of the right posterior ethmoid air
cells with associated osseous septal thinning.

Maxillary: Both maxillary sinuses are clear.

Sphenoid: There is complete opacification of the right sphenoid
sinus with widening/obliteration of the sphenoid ethmoidal recess.
The left sphenoid sinus is clear and its sphenoethmoidal recess is
patent.

Right ostiomeatal unit: Patent.

Left ostiomeatal unit: Patent.

Nasal passages: Patent. There is sigmoid septal deviation with 3 mm
rightward displacement superiorly and 2 mm leftward displacement
inferiorly.

Anatomy: There is pneumatization superior to both anterior ethmoid
notches. Possible defect of the right olfactory groove (series 5,
image 33). Keros type I configuration. Sellar sphenoid
pneumatization pattern. Pneumatization of the left clinoid process.

Other: Orbits and intracranial compartment are unremarkable. Visible
mastoid air cells are normally aerated.
IMPRESSION: 1. Complete opacification of the right sphenoid sinus with extension
of mixed density material into the posterior right ethmoid air
cells, with associated thinning of the ethmoid septae.
2. Possible dehiscence of the floor of the right olfactory groove.
3. The other paranasal sinuses and sinus drainage pathways are
clear.

## 2019-05-27 ENCOUNTER — Ambulatory Visit: Payer: Medicare Other | Admitting: Orthotics

## 2019-05-27 ENCOUNTER — Ambulatory Visit: Payer: No Typology Code available for payment source | Admitting: Podiatry

## 2019-05-27 ENCOUNTER — Ambulatory Visit (INDEPENDENT_AMBULATORY_CARE_PROVIDER_SITE_OTHER): Payer: Medicare Other | Admitting: Podiatry

## 2019-05-27 ENCOUNTER — Encounter: Payer: Self-pay | Admitting: Podiatry

## 2019-05-27 ENCOUNTER — Other Ambulatory Visit: Payer: Self-pay

## 2019-05-27 VITALS — Temp 98.1°F

## 2019-05-27 DIAGNOSIS — D689 Coagulation defect, unspecified: Secondary | ICD-10-CM

## 2019-05-27 DIAGNOSIS — E1151 Type 2 diabetes mellitus with diabetic peripheral angiopathy without gangrene: Secondary | ICD-10-CM

## 2019-05-27 DIAGNOSIS — I739 Peripheral vascular disease, unspecified: Secondary | ICD-10-CM

## 2019-05-27 DIAGNOSIS — E1142 Type 2 diabetes mellitus with diabetic polyneuropathy: Secondary | ICD-10-CM | POA: Diagnosis not present

## 2019-05-27 DIAGNOSIS — B351 Tinea unguium: Secondary | ICD-10-CM | POA: Diagnosis not present

## 2019-05-27 DIAGNOSIS — M79676 Pain in unspecified toe(s): Secondary | ICD-10-CM | POA: Diagnosis not present

## 2019-05-27 DIAGNOSIS — M2011 Hallux valgus (acquired), right foot: Secondary | ICD-10-CM

## 2019-05-27 DIAGNOSIS — M2041 Other hammer toe(s) (acquired), right foot: Secondary | ICD-10-CM

## 2019-05-27 NOTE — Progress Notes (Signed)

## 2019-05-27 NOTE — Progress Notes (Signed)
He presents today to have his diabetic shoes sized.  He also wants to talk about surgically correcting the bunion deformity on the right foot.  He states that is become painful and exquisitely tender and is just hard to get around and he is very active and wants to continue to do so as he approaches 73 years old.  He states that all in all he is in pretty good health other than diabetes.  Objective: Vital signs are stable he is alert and oriented x3.  Pulses are palpable.  They are diminished.  And his capillary fill time is diminished to the tips of the toes.  Hallux abductovalgus deformity significant.  Hammertoe deformity second right.  Assessment: Peripheral vascular disease secondary to diabetes.  Hallux valgus deformity plantarflexed second metatarsal hammertoe deformity second right.  Plan: At this point to consider surgical intervention we will send him for a vascular evaluation and consult.  He will see Liliane Channel today for diabetic shoe fabrication.

## 2019-05-28 ENCOUNTER — Telehealth: Payer: Self-pay | Admitting: *Deleted

## 2019-05-28 ENCOUNTER — Telehealth: Payer: Self-pay | Admitting: Cardiovascular Disease

## 2019-05-28 DIAGNOSIS — I739 Peripheral vascular disease, unspecified: Secondary | ICD-10-CM

## 2019-05-28 DIAGNOSIS — E1151 Type 2 diabetes mellitus with diabetic peripheral angiopathy without gangrene: Secondary | ICD-10-CM

## 2019-05-28 DIAGNOSIS — D689 Coagulation defect, unspecified: Secondary | ICD-10-CM

## 2019-05-28 NOTE — Telephone Encounter (Signed)
LVM for patient to call and schedule new patient appointment with Dr. Gwenlyn Found.

## 2019-05-28 NOTE — Telephone Encounter (Signed)
Faxed orders to [facility]. 

## 2019-05-28 NOTE — Telephone Encounter (Signed)
-----   Message from Rip Harbour, [facility] sent at 05/27/2019 11:31 AM EDT ----- Regarding: Vascular Vascular consult and studies - PAD, diabetic neuropathy-need ABI's - surgical consideration

## 2019-05-29 ENCOUNTER — Telehealth: Payer: Self-pay | Admitting: Podiatry

## 2019-05-29 NOTE — Telephone Encounter (Signed)
Pt had a referral sent in to have vascular testing done and the facility was not able to get him scheduled for an appt for a couple of weeks out. Pt called [facility] and ask that they help him get scheduled with another vascular office sooner. [facility] would like for [place] to withdraw our current referral for the patient to [facility] heart care.

## 2019-05-29 NOTE — Telephone Encounter (Signed)
ok 

## 2019-06-09 ENCOUNTER — Encounter (INDEPENDENT_AMBULATORY_CARE_PROVIDER_SITE_OTHER): Payer: Medicare Other | Admitting: Ophthalmology

## 2019-06-09 ENCOUNTER — Other Ambulatory Visit: Payer: Self-pay

## 2019-06-09 DIAGNOSIS — H43813 Vitreous degeneration, bilateral: Secondary | ICD-10-CM

## 2019-06-09 DIAGNOSIS — H353112 Nonexudative age-related macular degeneration, right eye, intermediate dry stage: Secondary | ICD-10-CM | POA: Diagnosis not present

## 2019-06-09 DIAGNOSIS — E113292 Type 2 diabetes mellitus with mild nonproliferative diabetic retinopathy without macular edema, left eye: Secondary | ICD-10-CM

## 2019-06-09 DIAGNOSIS — E11319 Type 2 diabetes mellitus with unspecified diabetic retinopathy without macular edema: Secondary | ICD-10-CM

## 2019-06-09 DIAGNOSIS — H353121 Nonexudative age-related macular degeneration, left eye, early dry stage: Secondary | ICD-10-CM | POA: Diagnosis not present

## 2019-06-13 ENCOUNTER — Encounter: Payer: 59 | Admitting: Vascular Surgery

## 2019-06-13 ENCOUNTER — Ambulatory Visit (HOSPITAL_COMMUNITY): Payer: Self-pay

## 2019-06-19 ENCOUNTER — Ambulatory Visit (HOSPITAL_COMMUNITY)
Admission: RE | Admit: 2019-06-19 | Payer: Medicare Other | Source: Ambulatory Visit | Attending: Podiatry | Admitting: Podiatry

## 2019-06-24 ENCOUNTER — Telehealth: Payer: Self-pay | Admitting: Podiatry

## 2019-06-24 NOTE — Telephone Encounter (Signed)
Pt left message checking on status of shoes.  Returned call and we are having trouble getting in touch with [facility] but will continue to try and will let pt know when we get any information.

## 2019-06-25 ENCOUNTER — Other Ambulatory Visit: Payer: Self-pay

## 2019-06-25 ENCOUNTER — Telehealth (HOSPITAL_COMMUNITY): Payer: Self-pay

## 2019-06-25 DIAGNOSIS — M779 Enthesopathy, unspecified: Secondary | ICD-10-CM

## 2019-06-25 NOTE — Telephone Encounter (Signed)
Left voicemail for patient re: Covid-19.

## 2019-06-26 ENCOUNTER — Other Ambulatory Visit: Payer: Self-pay

## 2019-06-26 ENCOUNTER — Ambulatory Visit (HOSPITAL_COMMUNITY)
Admission: RE | Admit: 2019-06-26 | Discharge: 2019-06-26 | Disposition: A | Discharge status: Home / Self Care | Payer: Medicare Other | Source: Ambulatory Visit | Attending: Vascular Surgery | Admitting: Vascular Surgery

## 2019-06-26 ENCOUNTER — Ambulatory Visit (INDEPENDENT_AMBULATORY_CARE_PROVIDER_SITE_OTHER): Payer: Medicare Other | Admitting: Vascular Surgery

## 2019-06-26 ENCOUNTER — Encounter: Payer: Self-pay | Admitting: Vascular Surgery

## 2019-06-26 VITALS — BP 140/83 | HR 87 | Temp 98.3°F | Resp 20 | Ht 70.0 in | Wt 189.6 lb

## 2019-06-26 DIAGNOSIS — M779 Enthesopathy, unspecified: Secondary | ICD-10-CM

## 2019-06-26 DIAGNOSIS — I739 Peripheral vascular disease, unspecified: Secondary | ICD-10-CM | POA: Diagnosis not present

## 2019-06-26 NOTE — Progress Notes (Signed)
Referring Physician: Dr Lucia Gaskins  Patient name: Bradley Gilmore MRN: [id number] DOB: [birth year] Sex: male  REASON FOR CONSULT: Pre Operative vascular evaluation for peripheral arterial disease  HPI: Bradley Gilmore is a 73 y.o. male with approximately 15-year history of diabetes.  He has being considered for repair of a bunion of the right foot with hammertoe.  He was referred by his orthopedic surgeon to make sure that he had adequate perfusion for healing.  The patient denies any claudication symptoms.  He denies rest pain.  He has never smoked.  He denies coronary artery disease.  He has not really had problems healing up wounds in the past.  He is also followed by Dr. Milinda Pointer from podiatry.  Also calcification of the arteries in the foot was noted on the patient to x-ray so he was referred for further evaluation.  He is on a statin.  He was on aspirin in the past but this was discontinued I was unable to find the reason for this.  Past Medical History:  Diagnosis Date  . Diabetes mellitus   . Diabetes mellitus without complication ([place])   . Diverticulitis    Past Surgical History:  Procedure Laterality Date  . APPENDECTOMY    . CYSTOSCOPY/RETROGRADE/URETEROSCOPY  07/25/2012   Procedure: CYSTOSCOPY/RETROGRADE/URETEROSCOPY;  Surgeon: Fredricka Bonine, MD;  Location: [facility];  Service: Urology;  Laterality: Right;  . TOTAL KNEE ARTHROPLASTY  2011   L  . TOTAL KNEE ARTHROPLASTY Right 08/28/2016   Procedure: RIGHT TOTAL KNEE ARTHROPLASTY;  Surgeon: Gaynelle Arabian, MD;  Location: [facility];  Service: Orthopedics;  Laterality: Right;    History reviewed. No pertinent family history.  He denies any family history of aneurysm  SOCIAL HISTORY: Social History   Socioeconomic History  . Marital status: Married    Spouse name: Not on file  . Number of children: Not on file  . Years of education: Not on file  . Highest education level: Not on file  Occupational History  . Not on file   Social Needs  . Financial resource strain: Not on file  . Food insecurity    Worry: Not on file    Inability: Not on file  . Transportation needs    Medical: Not on file    Non-medical: Not on file  Tobacco Use  . Smoking status: Never Smoker  . Smokeless tobacco: Never Used  Substance and Sexual Activity  . Alcohol use: No  . Drug use: No  . Sexual activity: Not on file  Lifestyle  . Physical activity    Days per week: Not on file    Minutes per session: Not on file  . Stress: Not on file  Relationships  . Social Herbalist on phone: Not on file    Gets together: Not on file    Attends religious service: Not on file    Active member of club or organization: Not on file    Attends meetings of clubs or organizations: Not on file    Relationship status: Not on file  . Intimate partner violence    Fear of current or ex partner: Not on file    Emotionally abused: Not on file    Physically abused: Not on file    Forced sexual activity: Not on file  Other Topics Concern  . Not on file  Social History Narrative   ** Merged History Encounter **        No Known Allergies  Current Outpatient Medications  Medication Sig Dispense Refill  . glimepiride (AMARYL) 4 MG tablet Take 4 mg by mouth daily before breakfast.    . metFORMIN (GLUCOPHAGE-XR) 500 MG 24 hr tablet     . omeprazole (PRILOSEC) 20 MG capsule Take 20 mg by mouth daily.    . ONE TOUCH ULTRA TEST test strip CHECK PATIENTS BLOOD GLUCOSE ONCE DAILY, ALTERNATING BETWEEN MORNING AND EVENING MEAL (E11.9)    . simvastatin (ZOCOR) 20 MG tablet Take 20 mg by mouth daily.    . TRULICITY 6.22 [facility]/7.9GX SOPN INJECT 1 PEN SUBCUTANEOUSLY ONCE WEEKLY  3   No current facility-administered medications for this visit.     ROS:   General:  No weight loss, Fever, chills  HEENT: No recent headaches, no nasal bleeding, no visual changes, no sore throat  Neurologic: No dizziness, blackouts, seizures. No recent symptoms  of stroke or mini- stroke. No recent episodes of slurred speech, or temporary blindness.  Cardiac: No recent episodes of chest pain/pressure, no shortness of breath at rest.  No shortness of breath with exertion.  Denies history of atrial fibrillation or irregular heartbeat  Vascular: No history of rest pain in feet.  No history of claudication.  No history of non-healing ulcer, No history of DVT   Pulmonary: No home oxygen, no productive cough, no hemoptysis,  No asthma or wheezing  Musculoskeletal:  [X]  Arthritis, [ ]  Low back pain,  [X]  Joint pain  Hematologic:No history of hypercoagulable state.  No history of easy bleeding.  No history of anemia  Gastrointestinal: No hematochezia or melena,  No gastroesophageal reflux, no trouble swallowing  Urinary: [ ]  chronic Kidney disease, [ ]  on HD - [ ]  MWF or [ ]  TTHS, [ ]  Burning with urination, [ ]  Frequent urination, [ ]  Difficulty urinating;   Skin: No rashes  Psychological: No history of anxiety,  No history of depression   Physical Examination  Vitals:   06/26/19 1453  BP: 140/83  Pulse: 87  Resp: 20  Temp: 98.3 F (36.8 C)  SpO2: 99%  Weight: 189 lb 9.6 oz (86 kg)  Height: 5\' 10"  (1.778 m)    Body mass index is 27.2 kg/m.  General:  Alert and oriented, no acute distress HEENT: Normal Neck: No JVD Pulmonary: Clear to auscultation bilaterally Cardiac: Regular Rate and Rhythm  Abdomen: Soft, non-tender, non-distended, no mass Skin: No rash Extremity Pulses:  2+ radial, brachial, femoral, dorsalis pedis, posterior tibial pulses bilaterally Musculoskeletal: No deformity with the exception of hammertoes and a bunion on the right foot or edema  Neurologic: Upper and lower extremity motor 5/5 and symmetric  DATA:  She had bilateral ABIs performed today which were greater than 1 calcified and non-reliable bilaterally.  However, he did have a toe pressure on the left side 127 and a toe pressure on the right side of 100.   Usually toe pressures greater than 50 have reasonable healing potential.  His waveforms were triphasic and this was essentially a normal study other than calcification of the vessels.  ASSESSMENT: Patient probably does have an underlying component of peripheral arterial disease which is nonobstructive currently.  This is probably related to his diabetes.  I believe he has adequate perfusion for wound healing with palpable pulses in both feet.  He has adequate toe pressure.   PLAN: Patient should be able to proceed with operative therapy on his right foot with adequate wound healing potential.  He is currently on a statin.  I  do believe he would benefit from being on aspirin as well with underlying peripheral arterial disease.  He will start 81 mg of aspirin daily today.  He has no history of GI upset from this.  Otherwise the patient will follow-up with me on as-needed basis.   Ruta Hinds, MD [facility] Office: [phone] Pager: [phone]

## 2019-06-30 ENCOUNTER — Telehealth: Payer: Self-pay | Admitting: *Deleted

## 2019-06-30 NOTE — Telephone Encounter (Signed)
Call placed to the patient to discuss his appointment for tomorrow with Dr. Fletcher Anon. He stated that he "would pass" for now and did not feel like the consult appointment was needed. He has been advised to call if he changes his mind.

## 2019-07-01 ENCOUNTER — Other Ambulatory Visit: Payer: Self-pay

## 2019-07-01 ENCOUNTER — Ambulatory Visit (INDEPENDENT_AMBULATORY_CARE_PROVIDER_SITE_OTHER): Payer: Medicare Other | Admitting: Orthotics

## 2019-07-01 ENCOUNTER — Telehealth: Payer: 59 | Admitting: Cardiovascular Disease

## 2019-07-01 DIAGNOSIS — M2041 Other hammer toe(s) (acquired), right foot: Secondary | ICD-10-CM | POA: Diagnosis not present

## 2019-07-01 DIAGNOSIS — M6788 Other specified disorders of synovium and tendon, other site: Secondary | ICD-10-CM

## 2019-07-01 DIAGNOSIS — M2011 Hallux valgus (acquired), right foot: Secondary | ICD-10-CM | POA: Diagnosis not present

## 2019-07-01 DIAGNOSIS — E1151 Type 2 diabetes mellitus with diabetic peripheral angiopathy without gangrene: Secondary | ICD-10-CM | POA: Diagnosis not present

## 2019-07-01 DIAGNOSIS — M779 Enthesopathy, unspecified: Secondary | ICD-10-CM

## 2019-07-01 DIAGNOSIS — E1142 Type 2 diabetes mellitus with diabetic polyneuropathy: Secondary | ICD-10-CM

## 2019-07-01 DIAGNOSIS — M2042 Other hammer toe(s) (acquired), left foot: Secondary | ICD-10-CM

## 2019-07-01 DIAGNOSIS — I739 Peripheral vascular disease, unspecified: Secondary | ICD-10-CM

## 2019-07-01 NOTE — Progress Notes (Signed)

## 2019-08-21 ENCOUNTER — Other Ambulatory Visit: Payer: Self-pay

## 2019-08-21 ENCOUNTER — Ambulatory Visit (INDEPENDENT_AMBULATORY_CARE_PROVIDER_SITE_OTHER): Payer: Medicare Other | Admitting: Podiatry

## 2019-08-21 ENCOUNTER — Encounter: Payer: Self-pay | Admitting: Podiatry

## 2019-08-21 DIAGNOSIS — E1142 Type 2 diabetes mellitus with diabetic polyneuropathy: Secondary | ICD-10-CM | POA: Diagnosis not present

## 2019-08-21 DIAGNOSIS — M79676 Pain in unspecified toe(s): Secondary | ICD-10-CM

## 2019-08-21 DIAGNOSIS — B351 Tinea unguium: Secondary | ICD-10-CM

## 2019-08-21 NOTE — Progress Notes (Signed)
He presents today chief complaint of painful corns and calluses as well as painful elongated toenails.  Objective: Toenails are long thick yellow dystrophic with mycotic multiple reactive hyper keratomas to the plantar aspect of the bilateral foot.  No open lesions or wounds.  Assessment: Pain limb secondary onychomycosis and porokeratosis.  Plan: Debridement of toenails 1 through 5 bilateral debridement of all hyperkeratotic tissue.  Follow-up with him in a few months

## 2019-11-20 ENCOUNTER — Ambulatory Visit (INDEPENDENT_AMBULATORY_CARE_PROVIDER_SITE_OTHER): Payer: Medicare Other | Admitting: Podiatry

## 2019-11-20 ENCOUNTER — Other Ambulatory Visit: Payer: Self-pay

## 2019-11-20 DIAGNOSIS — B351 Tinea unguium: Secondary | ICD-10-CM

## 2019-11-20 DIAGNOSIS — L84 Corns and callosities: Secondary | ICD-10-CM

## 2019-11-20 DIAGNOSIS — M79676 Pain in unspecified toe(s): Secondary | ICD-10-CM | POA: Diagnosis not present

## 2019-11-20 DIAGNOSIS — E1142 Type 2 diabetes mellitus with diabetic polyneuropathy: Secondary | ICD-10-CM | POA: Diagnosis not present

## 2019-11-22 NOTE — Progress Notes (Signed)
Bradley Gilmore presents today chief complaint of painfully elongated toenails and some numbness and tingling.  States that his legs are cool to the touch  Objective: Vital signs are stable he is alert oriented x3 fasting blood sugar 7.1.  Pulses are barely palpable feet are actually warm to the touch capillary fill time is immediate no open lesions or wounds.  Mild reactive hyperkeratosis to the plantar aspect of the foot medial aspect of the first metatarsophalangeal joint.  Also has hammertoe deformities painfully elongated clinically mycotic nails.  Assessment: Pain in limb secondary to onychomycosis diabetic peripheral neuropathy angiopathy  Plan: Debridement of toenails and calluses 1 through 5 bilateral.  Follow-up with him in 3 months

## 2020-02-02 ENCOUNTER — Other Ambulatory Visit: Payer: Self-pay | Admitting: Internal Medicine

## 2020-02-02 ENCOUNTER — Other Ambulatory Visit (HOSPITAL_COMMUNITY): Payer: Self-pay | Admitting: Internal Medicine

## 2020-02-02 DIAGNOSIS — R1012 Left upper quadrant pain: Secondary | ICD-10-CM

## 2020-02-03 ENCOUNTER — Ambulatory Visit (HOSPITAL_COMMUNITY)
Admission: RE | Admit: 2020-02-03 | Discharge: 2020-02-03 | Disposition: A | Discharge status: Home / Self Care | Payer: Medicare Other | Source: Ambulatory Visit | Attending: Internal Medicine | Admitting: Internal Medicine

## 2020-02-03 ENCOUNTER — Other Ambulatory Visit: Payer: Self-pay

## 2020-02-03 DIAGNOSIS — R1012 Left upper quadrant pain: Secondary | ICD-10-CM | POA: Insufficient documentation

## 2020-02-03 MED ORDER — IOHEXOL 300 MG/ML  SOLN
100.0000 mL | Freq: Once | INTRAMUSCULAR | Status: AC | PRN
  Administered 2020-02-03: 12:00:00 100 mL via INTRAVENOUS

## 2020-02-03 MED ORDER — SODIUM CHLORIDE (PF) 0.9 % IJ SOLN
INTRAMUSCULAR | Status: AC
  Filled 2020-02-03: qty 50

## 2020-02-19 ENCOUNTER — Ambulatory Visit: Payer: Medicare Other | Admitting: Podiatry

## 2020-02-24 ENCOUNTER — Other Ambulatory Visit: Payer: Self-pay

## 2020-02-24 ENCOUNTER — Encounter: Payer: Self-pay | Admitting: Podiatry

## 2020-02-24 ENCOUNTER — Ambulatory Visit (INDEPENDENT_AMBULATORY_CARE_PROVIDER_SITE_OTHER): Payer: Medicare Other | Admitting: Podiatry

## 2020-02-24 VITALS — Temp 96.5°F

## 2020-02-24 DIAGNOSIS — E1142 Type 2 diabetes mellitus with diabetic polyneuropathy: Secondary | ICD-10-CM

## 2020-02-24 DIAGNOSIS — M79676 Pain in unspecified toe(s): Secondary | ICD-10-CM | POA: Diagnosis not present

## 2020-02-24 DIAGNOSIS — B351 Tinea unguium: Secondary | ICD-10-CM

## 2020-02-25 NOTE — Progress Notes (Signed)
He presents today chief complaint of painfully elongated toenails numbness in his feet and painful calluses.  Objective: Vital signs are stable he is alert oriented x3.  Pulses are palpable neurologic sensorium is diminished per Semmes Weinstein monofilament skin to the feet it is dry and scaly mild reactive hyperkeratotic lesion subforefoot bilateral no open lesions or wounds no preulcerative lesions.  Toenails are thick yellow dystrophic clinically mycotic.  Assessment: Pain limb secondary to diabetic peripheral neuropathy; onychomycosis; reactive hyperkeratosis.  Plan: Debrided nails 1 through 5 bilaterally.  Debrided reactive hyperkeratoses bilateral.  Hope he had a nice birthday and once again thank him for the pen.

## 2020-03-08 ENCOUNTER — Encounter (INDEPENDENT_AMBULATORY_CARE_PROVIDER_SITE_OTHER): Payer: Medicare Other | Admitting: Ophthalmology

## 2020-05-24 ENCOUNTER — Encounter (INDEPENDENT_AMBULATORY_CARE_PROVIDER_SITE_OTHER): Payer: Medicare Other | Admitting: Ophthalmology

## 2020-05-27 ENCOUNTER — Ambulatory Visit (INDEPENDENT_AMBULATORY_CARE_PROVIDER_SITE_OTHER): Payer: Medicare Other | Admitting: Podiatry

## 2020-05-27 ENCOUNTER — Encounter: Payer: Self-pay | Admitting: Podiatry

## 2020-05-27 ENCOUNTER — Other Ambulatory Visit: Payer: Self-pay

## 2020-05-27 DIAGNOSIS — B351 Tinea unguium: Secondary | ICD-10-CM

## 2020-05-27 DIAGNOSIS — M79676 Pain in unspecified toe(s): Secondary | ICD-10-CM

## 2020-05-27 DIAGNOSIS — Q828 Other specified congenital malformations of skin: Secondary | ICD-10-CM | POA: Diagnosis not present

## 2020-05-27 DIAGNOSIS — E1142 Type 2 diabetes mellitus with diabetic polyneuropathy: Secondary | ICD-10-CM

## 2020-05-27 NOTE — Progress Notes (Signed)
He presents today chief complaint of painfully elongated toenails and calluses.  Objective: Pulses are barely palpable bilateral capillary fill time is immediate feet are warm to the touch.  Toenails are long thick yellow dystrophic clinically mycotic multiple reactive hyperkeratotic lesions plantar aspect of the foot and toes.  Assessment: Pain in limb secondary to onychomycosis.  Diabetic peripheral arterial disease.  Painful calluses bilateral.  Plan: Debridement of toenails 1 through 5 bilateral.  Debridement of calluses bilateral foot.  No iatrogenic lesions noted.  Follow-up in 3 months.

## 2020-06-08 ENCOUNTER — Other Ambulatory Visit: Payer: Self-pay

## 2020-06-08 ENCOUNTER — Encounter (INDEPENDENT_AMBULATORY_CARE_PROVIDER_SITE_OTHER): Payer: Medicare Other | Admitting: Ophthalmology

## 2020-06-08 DIAGNOSIS — H353121 Nonexudative age-related macular degeneration, left eye, early dry stage: Secondary | ICD-10-CM

## 2020-06-08 DIAGNOSIS — H353112 Nonexudative age-related macular degeneration, right eye, intermediate dry stage: Secondary | ICD-10-CM

## 2020-06-08 DIAGNOSIS — H43813 Vitreous degeneration, bilateral: Secondary | ICD-10-CM

## 2020-06-08 DIAGNOSIS — E113292 Type 2 diabetes mellitus with mild nonproliferative diabetic retinopathy without macular edema, left eye: Secondary | ICD-10-CM

## 2020-06-08 DIAGNOSIS — E11319 Type 2 diabetes mellitus with unspecified diabetic retinopathy without macular edema: Secondary | ICD-10-CM

## 2020-08-31 ENCOUNTER — Ambulatory Visit (INDEPENDENT_AMBULATORY_CARE_PROVIDER_SITE_OTHER): Payer: Medicare Other | Admitting: Podiatry

## 2020-08-31 ENCOUNTER — Other Ambulatory Visit: Payer: Self-pay

## 2020-08-31 ENCOUNTER — Encounter: Payer: Self-pay | Admitting: Podiatry

## 2020-08-31 DIAGNOSIS — E1142 Type 2 diabetes mellitus with diabetic polyneuropathy: Secondary | ICD-10-CM

## 2020-08-31 DIAGNOSIS — B351 Tinea unguium: Secondary | ICD-10-CM

## 2020-08-31 DIAGNOSIS — Q828 Other specified congenital malformations of skin: Secondary | ICD-10-CM | POA: Diagnosis not present

## 2020-08-31 DIAGNOSIS — M79676 Pain in unspecified toe(s): Secondary | ICD-10-CM | POA: Diagnosis not present

## 2020-09-01 NOTE — Progress Notes (Signed)
He presents today chief complaint of painfully elongated toenails and calluses bilaterally.  Objective: Toenails are long thick yellow dystrophic-like mycotic multiple reactive hyperkeratotic tissues.  Assessment pain in limb secondary to onychomycosis and porokeratosis.  Plan: Debridement of toenails 1 through 5 bilateral debridement of all reactive hyperkeratotic tissue.

## 2020-10-25 ENCOUNTER — Other Ambulatory Visit: Payer: Self-pay

## 2020-10-25 ENCOUNTER — Ambulatory Visit (INDEPENDENT_AMBULATORY_CARE_PROVIDER_SITE_OTHER): Payer: Medicare Other | Admitting: Podiatry

## 2020-10-25 ENCOUNTER — Encounter: Payer: Self-pay | Admitting: Podiatry

## 2020-10-25 DIAGNOSIS — L84 Corns and callosities: Secondary | ICD-10-CM | POA: Diagnosis not present

## 2020-10-25 DIAGNOSIS — E1142 Type 2 diabetes mellitus with diabetic polyneuropathy: Secondary | ICD-10-CM

## 2020-10-25 DIAGNOSIS — M79676 Pain in unspecified toe(s): Secondary | ICD-10-CM | POA: Diagnosis not present

## 2020-10-25 DIAGNOSIS — B351 Tinea unguium: Secondary | ICD-10-CM | POA: Diagnosis not present

## 2020-10-25 DIAGNOSIS — Q828 Other specified congenital malformations of skin: Secondary | ICD-10-CM

## 2020-10-25 DIAGNOSIS — M2041 Other hammer toe(s) (acquired), right foot: Secondary | ICD-10-CM | POA: Diagnosis not present

## 2020-10-25 DIAGNOSIS — M2042 Other hammer toe(s) (acquired), left foot: Secondary | ICD-10-CM | POA: Diagnosis not present

## 2020-10-27 ENCOUNTER — Encounter: Payer: Self-pay | Admitting: Podiatry

## 2020-10-27 NOTE — Progress Notes (Signed)
  Subjective:  Patient ID: Bradley Gilmore, male    DOB: 04/03/1946,  MRN: [id number]  Chief Complaint  Patient presents with  . Ingrown Toenail    patient presents today for ingrown toenail right hallux medial border and callous right hallux.  He says his toes are tender across tops of toes  . Callouses    74 y.o. male presents with the above complaint. History confirmed with patient.   Objective:  Physical Exam: warm, good capillary refill, no trophic changes or ulcerative lesions, normal DP and PT pulses, normal monofilament exam and normal sensory exam. Onychomycosis x10, callus submet 5 bilaterally  Assessment:  No diagnosis found.   Plan:  Patient was evaluated and treated and all questions answered.   Patient educated on diabetes. Discussed proper diabetic foot care and discussed risks and complications of disease. Educated patient in depth on reasons to return to the office immediately should he/she discover anything concerning or new on the feet. All questions answered. Discussed proper shoes as well.   Discussed the etiology and treatment options for the condition in detail with the patient. Educated patient on the topical and oral treatment options for mycotic nails. Recommended debridement of the nails today. Sharp and mechanical debridement performed of all painful and mycotic nails today. Nails debrided in length and thickness using a nail nipper and a mechanical burr to level of comfort. Discussed treatment options including appropriate shoe gear. Follow up as needed for painful nails.  All symptomatic hyperkeratoses were safely debrided with a sterile #15 blade to patient's level of comfort without incident. We discussed preventative and palliative care of these lesions including supportive and accommodative shoegear, padding, prefabricated and custom molded accommodative orthoses, use of a pumice stone and lotions/creams daily.   No follow-ups on file.

## 2020-11-30 ENCOUNTER — Other Ambulatory Visit: Payer: Self-pay

## 2020-11-30 ENCOUNTER — Encounter: Payer: Self-pay | Admitting: Podiatry

## 2020-11-30 ENCOUNTER — Ambulatory Visit (INDEPENDENT_AMBULATORY_CARE_PROVIDER_SITE_OTHER): Payer: Medicare Other | Admitting: Podiatry

## 2020-11-30 DIAGNOSIS — I739 Peripheral vascular disease, unspecified: Secondary | ICD-10-CM

## 2020-11-30 DIAGNOSIS — E1142 Type 2 diabetes mellitus with diabetic polyneuropathy: Secondary | ICD-10-CM

## 2020-11-30 DIAGNOSIS — M79676 Pain in unspecified toe(s): Secondary | ICD-10-CM

## 2020-11-30 DIAGNOSIS — B351 Tinea unguium: Secondary | ICD-10-CM | POA: Diagnosis not present

## 2020-11-30 DIAGNOSIS — M778 Other enthesopathies, not elsewhere classified: Secondary | ICD-10-CM | POA: Diagnosis not present

## 2020-11-30 DIAGNOSIS — M2011 Hallux valgus (acquired), right foot: Secondary | ICD-10-CM

## 2020-11-30 DIAGNOSIS — M2041 Other hammer toe(s) (acquired), right foot: Secondary | ICD-10-CM

## 2020-11-30 DIAGNOSIS — L989 Disorder of the skin and subcutaneous tissue, unspecified: Secondary | ICD-10-CM | POA: Diagnosis not present

## 2020-11-30 MED ORDER — DEXAMETHASONE SODIUM PHOSPHATE 120 MG/30ML IJ SOLN
2.0000 mg | Freq: Once | INTRAMUSCULAR | Status: AC
  Administered 2020-11-30: 2 mg via INTRA_ARTICULAR

## 2020-11-30 NOTE — Progress Notes (Signed)
He presents today chief complaint of painfully elongated toenails.  He is also complaining of pain beneath the second metatarsophalangeal joint of the right foot.  He would also like to consider new diabetic shoes.  Objective: Vital signs are stable he is alert oriented x3.  Pulses are palpable.  He has decreased sensorium per Semmes Weinstein monofilament hammertoe deformities with plantarflexed metatarsals resulting in preulcerative lesions as well as pain for capsulitis.'s toenails are long thick yellow dystrophic clinically mycotic.  Assessment: Hammertoe deformity diabetic peripheral neuropathy preulcerative lesions and painful mycotic nails.  Capsulitis second metatarsophalangeal joint.  Plan: Discussed etiology pathology and surgical therapies debrided nails 1 through 5 bilaterally debrided all reactive hyperkeratotic tissue he will follow up with Liliane Channel for orthotics.  And I injected the capsulitis subsecond metatarsophalangeal joint.  Inject again with 2 mg of dexamethasone and local anesthetic from the plantar aspect of the foot.

## 2020-12-09 ENCOUNTER — Other Ambulatory Visit: Payer: Self-pay

## 2020-12-09 ENCOUNTER — Ambulatory Visit: Payer: Medicare Other | Admitting: Orthotics

## 2020-12-09 DIAGNOSIS — E1142 Type 2 diabetes mellitus with diabetic polyneuropathy: Secondary | ICD-10-CM

## 2020-12-09 DIAGNOSIS — M2041 Other hammer toe(s) (acquired), right foot: Secondary | ICD-10-CM

## 2020-12-09 DIAGNOSIS — M2042 Other hammer toe(s) (acquired), left foot: Secondary | ICD-10-CM

## 2020-12-09 DIAGNOSIS — M79676 Pain in unspecified toe(s): Secondary | ICD-10-CM

## 2020-12-09 DIAGNOSIS — B351 Tinea unguium: Secondary | ICD-10-CM

## 2020-12-09 NOTE — Progress Notes (Signed)

## 2020-12-14 ENCOUNTER — Ambulatory Visit: Payer: Medicare Other | Admitting: Orthotics

## 2021-02-09 ENCOUNTER — Encounter (INDEPENDENT_AMBULATORY_CARE_PROVIDER_SITE_OTHER): Payer: Medicare Other | Admitting: Ophthalmology

## 2021-02-09 ENCOUNTER — Other Ambulatory Visit: Payer: Self-pay

## 2021-02-09 DIAGNOSIS — E113292 Type 2 diabetes mellitus with mild nonproliferative diabetic retinopathy without macular edema, left eye: Secondary | ICD-10-CM | POA: Diagnosis not present

## 2021-02-09 DIAGNOSIS — H353112 Nonexudative age-related macular degeneration, right eye, intermediate dry stage: Secondary | ICD-10-CM

## 2021-02-09 DIAGNOSIS — H43813 Vitreous degeneration, bilateral: Secondary | ICD-10-CM | POA: Diagnosis not present

## 2021-03-03 ENCOUNTER — Encounter: Payer: Self-pay | Admitting: Podiatry

## 2021-03-03 ENCOUNTER — Ambulatory Visit (INDEPENDENT_AMBULATORY_CARE_PROVIDER_SITE_OTHER): Payer: Medicare Other | Admitting: Podiatry

## 2021-03-03 ENCOUNTER — Other Ambulatory Visit: Payer: Self-pay

## 2021-03-03 DIAGNOSIS — M79676 Pain in unspecified toe(s): Secondary | ICD-10-CM | POA: Diagnosis not present

## 2021-03-03 DIAGNOSIS — E1142 Type 2 diabetes mellitus with diabetic polyneuropathy: Secondary | ICD-10-CM

## 2021-03-03 DIAGNOSIS — M778 Other enthesopathies, not elsewhere classified: Secondary | ICD-10-CM | POA: Diagnosis not present

## 2021-03-03 DIAGNOSIS — B351 Tinea unguium: Secondary | ICD-10-CM

## 2021-03-03 MED ORDER — DEXAMETHASONE SODIUM PHOSPHATE 120 MG/30ML IJ SOLN
2.0000 mg | Freq: Once | INTRAMUSCULAR | Status: AC
  Administered 2021-03-03: 2 mg via INTRA_ARTICULAR

## 2021-03-03 NOTE — Progress Notes (Signed)
He presents today chief complaint of painful toenails and capsulitis of his second metatarsophalangeal joint of his right foot.  Objective: Toenails are long thick yellow dystrophic-like mycotic painful palpation as well as debridement pulses remain strong palpable.  Hammertoe deformities resulting in capsulitis of the second metatarsophalangeal joint of the right foot.  Assessment: Pain in limb secondary to capsulitis.  Pain in limb secondary to pain in limb onychomycosis.  Plan: Debridement of toenails 1 through 5 bilaterally.  And I injected dexamethasone to the plantar aspect of the second metatarsophalangeal joint.  Tolerated procedure well without complications follow-up with him in 3 months

## 2021-03-22 ENCOUNTER — Ambulatory Visit (INDEPENDENT_AMBULATORY_CARE_PROVIDER_SITE_OTHER): Payer: Medicare Other | Admitting: Podiatry

## 2021-03-22 ENCOUNTER — Other Ambulatory Visit: Payer: Self-pay

## 2021-03-22 DIAGNOSIS — E119 Type 2 diabetes mellitus without complications: Secondary | ICD-10-CM | POA: Diagnosis not present

## 2021-03-22 DIAGNOSIS — M2041 Other hammer toe(s) (acquired), right foot: Secondary | ICD-10-CM

## 2021-03-22 DIAGNOSIS — M2042 Other hammer toe(s) (acquired), left foot: Secondary | ICD-10-CM

## 2021-03-22 DIAGNOSIS — E1142 Type 2 diabetes mellitus with diabetic polyneuropathy: Secondary | ICD-10-CM

## 2021-03-22 DIAGNOSIS — M2012 Hallux valgus (acquired), left foot: Secondary | ICD-10-CM | POA: Diagnosis not present

## 2021-03-22 DIAGNOSIS — M2011 Hallux valgus (acquired), right foot: Secondary | ICD-10-CM | POA: Diagnosis not present

## 2021-03-22 NOTE — Progress Notes (Signed)
The patient presented to the office today to pick up diabetic shoes and 3 pair of diabetic custom inserts.  1 pair of inserts were put in the shoes and the shoes were fitted to the patient. The patient states they are comfortable and free of defect. He was satisfied with the fit of the shoe. Instructions for break in and wear were dispensed. The patient signed the delivery documentation and break in instruction form.  If any questions or concerns arise, he is instructed to call.

## 2021-03-24 ENCOUNTER — Telehealth: Payer: Self-pay | Admitting: Podiatry

## 2021-03-24 NOTE — Telephone Encounter (Signed)
Pt left message stating the diabetic shoes he picked up are not going to work. HE needs something with a soft top verses the hard top he pick previously.  I returned call and pt is scheduled 4.13 to come in and pick out a new shoe.

## 2021-03-30 ENCOUNTER — Ambulatory Visit (INDEPENDENT_AMBULATORY_CARE_PROVIDER_SITE_OTHER): Payer: Medicare Other | Admitting: Podiatry

## 2021-03-30 ENCOUNTER — Other Ambulatory Visit: Payer: Self-pay

## 2021-03-30 DIAGNOSIS — E1142 Type 2 diabetes mellitus with diabetic polyneuropathy: Secondary | ICD-10-CM

## 2021-03-30 DIAGNOSIS — M2041 Other hammer toe(s) (acquired), right foot: Secondary | ICD-10-CM

## 2021-03-30 DIAGNOSIS — M2042 Other hammer toe(s) (acquired), left foot: Secondary | ICD-10-CM

## 2021-03-30 NOTE — Progress Notes (Signed)
Patient presented today for exchange of the diabetic shoes x826 mens x-wide size 11.  Patient states that the shoes were not soft and would like to get a shoe that was soft and has some mesh on it and the patient picked out 521 x-wide size 11.  Patient will be contacted when the shoes are ready for pick up

## 2021-04-26 ENCOUNTER — Other Ambulatory Visit: Payer: Self-pay

## 2021-04-26 ENCOUNTER — Ambulatory Visit (INDEPENDENT_AMBULATORY_CARE_PROVIDER_SITE_OTHER): Payer: Medicare Other | Admitting: Podiatry

## 2021-04-26 DIAGNOSIS — E1142 Type 2 diabetes mellitus with diabetic polyneuropathy: Secondary | ICD-10-CM

## 2021-04-26 DIAGNOSIS — M2041 Other hammer toe(s) (acquired), right foot: Secondary | ICD-10-CM

## 2021-04-26 DIAGNOSIS — M2042 Other hammer toe(s) (acquired), left foot: Secondary | ICD-10-CM

## 2021-04-26 NOTE — Progress Notes (Signed)
Patient presents to the office today to pick up the diabetic shoes only.  Patient did not like the leather shoes and stated that they were not soft enough and liked the diabetic shoes that he got today.

## 2021-06-09 ENCOUNTER — Other Ambulatory Visit: Payer: Self-pay

## 2021-06-09 ENCOUNTER — Ambulatory Visit (INDEPENDENT_AMBULATORY_CARE_PROVIDER_SITE_OTHER): Payer: Medicare Other | Admitting: Podiatry

## 2021-06-09 DIAGNOSIS — Z23 Encounter for immunization: Secondary | ICD-10-CM | POA: Insufficient documentation

## 2021-06-09 DIAGNOSIS — G8929 Other chronic pain: Secondary | ICD-10-CM | POA: Insufficient documentation

## 2021-06-09 DIAGNOSIS — B351 Tinea unguium: Secondary | ICD-10-CM

## 2021-06-09 DIAGNOSIS — Z961 Presence of intraocular lens: Secondary | ICD-10-CM | POA: Insufficient documentation

## 2021-06-09 DIAGNOSIS — N529 Male erectile dysfunction, unspecified: Secondary | ICD-10-CM | POA: Insufficient documentation

## 2021-06-09 DIAGNOSIS — G4700 Insomnia, unspecified: Secondary | ICD-10-CM | POA: Insufficient documentation

## 2021-06-09 DIAGNOSIS — K219 Gastro-esophageal reflux disease without esophagitis: Secondary | ICD-10-CM | POA: Insufficient documentation

## 2021-06-09 DIAGNOSIS — M7670 Peroneal tendinitis, unspecified leg: Secondary | ICD-10-CM | POA: Insufficient documentation

## 2021-06-09 DIAGNOSIS — E1142 Type 2 diabetes mellitus with diabetic polyneuropathy: Secondary | ICD-10-CM

## 2021-06-09 DIAGNOSIS — I10 Essential (primary) hypertension: Secondary | ICD-10-CM | POA: Insufficient documentation

## 2021-06-09 DIAGNOSIS — L570 Actinic keratosis: Secondary | ICD-10-CM | POA: Insufficient documentation

## 2021-06-09 DIAGNOSIS — M79676 Pain in unspecified toe(s): Secondary | ICD-10-CM

## 2021-06-09 DIAGNOSIS — E559 Vitamin D deficiency, unspecified: Secondary | ICD-10-CM | POA: Insufficient documentation

## 2021-06-09 DIAGNOSIS — N200 Calculus of kidney: Secondary | ICD-10-CM | POA: Insufficient documentation

## 2021-06-09 DIAGNOSIS — E785 Hyperlipidemia, unspecified: Secondary | ICD-10-CM | POA: Insufficient documentation

## 2021-06-09 DIAGNOSIS — M5136 Other intervertebral disc degeneration, lumbar region: Secondary | ICD-10-CM | POA: Insufficient documentation

## 2021-06-09 DIAGNOSIS — H9190 Unspecified hearing loss, unspecified ear: Secondary | ICD-10-CM | POA: Insufficient documentation

## 2021-06-09 DIAGNOSIS — M1992 Post-traumatic osteoarthritis, unspecified site: Secondary | ICD-10-CM | POA: Insufficient documentation

## 2021-06-09 DIAGNOSIS — I739 Peripheral vascular disease, unspecified: Secondary | ICD-10-CM | POA: Insufficient documentation

## 2021-06-09 DIAGNOSIS — K115 Sialolithiasis: Secondary | ICD-10-CM | POA: Insufficient documentation

## 2021-06-09 DIAGNOSIS — N486 Induration penis plastica: Secondary | ICD-10-CM | POA: Insufficient documentation

## 2021-06-09 DIAGNOSIS — E669 Obesity, unspecified: Secondary | ICD-10-CM | POA: Insufficient documentation

## 2021-06-09 DIAGNOSIS — Z96659 Presence of unspecified artificial knee joint: Secondary | ICD-10-CM | POA: Insufficient documentation

## 2021-06-09 DIAGNOSIS — F419 Anxiety disorder, unspecified: Secondary | ICD-10-CM | POA: Insufficient documentation

## 2021-06-09 DIAGNOSIS — Z862 Personal history of diseases of the blood and blood-forming organs and certain disorders involving the immune mechanism: Secondary | ICD-10-CM | POA: Insufficient documentation

## 2021-06-09 DIAGNOSIS — M2040 Other hammer toe(s) (acquired), unspecified foot: Secondary | ICD-10-CM | POA: Insufficient documentation

## 2021-06-09 DIAGNOSIS — E113293 Type 2 diabetes mellitus with mild nonproliferative diabetic retinopathy without macular edema, bilateral: Secondary | ICD-10-CM | POA: Insufficient documentation

## 2021-06-09 NOTE — Progress Notes (Signed)
He presents today chief complaint of painfully elongated toenails bilaterally.  Objective: Vital signs stable alert oriented x3 pulses are palpable.  There is no erythema edema cellulitis drainage or odor.  Toenails are long thick yellow dystrophic-like mycotic.  Assessment: Pain in limb secondary to onychomycosis.  Plan: Remove toenails 1 through 5 bilateral.

## 2021-09-15 ENCOUNTER — Other Ambulatory Visit: Payer: Self-pay

## 2021-09-15 ENCOUNTER — Encounter: Payer: Self-pay | Admitting: Podiatry

## 2021-09-15 ENCOUNTER — Ambulatory Visit (INDEPENDENT_AMBULATORY_CARE_PROVIDER_SITE_OTHER): Payer: Medicare Other | Admitting: Podiatry

## 2021-09-15 DIAGNOSIS — M79676 Pain in unspecified toe(s): Secondary | ICD-10-CM

## 2021-09-15 DIAGNOSIS — D2371 Other benign neoplasm of skin of right lower limb, including hip: Secondary | ICD-10-CM

## 2021-09-15 DIAGNOSIS — E1142 Type 2 diabetes mellitus with diabetic polyneuropathy: Secondary | ICD-10-CM | POA: Diagnosis not present

## 2021-09-15 DIAGNOSIS — B351 Tinea unguium: Secondary | ICD-10-CM

## 2021-09-15 DIAGNOSIS — D2372 Other benign neoplasm of skin of left lower limb, including hip: Secondary | ICD-10-CM

## 2021-09-16 NOTE — Progress Notes (Signed)
He presents today chief complaint of painful elongated toenails and calluses bilateral.  Objective: Pulses are palpable.  Toenails are long thick yellow dystrophic clinically mycotic neurologic sensorium is diminished per Semmes Weinstein monofilament.  Assessment: Pain in limb secondary to benign skin lesions and painfully elongated nails as well as diabetic peripheral neuropathy.  Plan: Discussed etiology pathology conservative surgical therapies.  Debrided toenails 1 through 5 bilateral debrided benign skin lesions.  Follow-up 3 months

## 2021-11-09 ENCOUNTER — Encounter (INDEPENDENT_AMBULATORY_CARE_PROVIDER_SITE_OTHER): Payer: Medicare Other | Admitting: Ophthalmology

## 2021-11-09 ENCOUNTER — Other Ambulatory Visit: Payer: Self-pay

## 2021-11-09 DIAGNOSIS — H353112 Nonexudative age-related macular degeneration, right eye, intermediate dry stage: Secondary | ICD-10-CM | POA: Diagnosis not present

## 2021-11-09 DIAGNOSIS — H43813 Vitreous degeneration, bilateral: Secondary | ICD-10-CM

## 2021-11-09 DIAGNOSIS — E113393 Type 2 diabetes mellitus with moderate nonproliferative diabetic retinopathy without macular edema, bilateral: Secondary | ICD-10-CM | POA: Diagnosis not present

## 2021-12-15 ENCOUNTER — Ambulatory Visit (INDEPENDENT_AMBULATORY_CARE_PROVIDER_SITE_OTHER): Payer: Medicare Other | Admitting: Podiatry

## 2021-12-15 ENCOUNTER — Other Ambulatory Visit: Payer: Self-pay

## 2021-12-15 ENCOUNTER — Encounter: Payer: Self-pay | Admitting: Podiatry

## 2021-12-15 DIAGNOSIS — M79676 Pain in unspecified toe(s): Secondary | ICD-10-CM | POA: Diagnosis not present

## 2021-12-15 DIAGNOSIS — B351 Tinea unguium: Secondary | ICD-10-CM | POA: Diagnosis not present

## 2021-12-15 DIAGNOSIS — E1142 Type 2 diabetes mellitus with diabetic polyneuropathy: Secondary | ICD-10-CM | POA: Diagnosis not present

## 2021-12-15 DIAGNOSIS — D2371 Other benign neoplasm of skin of right lower limb, including hip: Secondary | ICD-10-CM

## 2021-12-15 DIAGNOSIS — D2372 Other benign neoplasm of skin of left lower limb, including hip: Secondary | ICD-10-CM

## 2021-12-15 NOTE — Progress Notes (Signed)
Chief complaint of painful elongated toenails.  Objective: Toenails are long thick yellow dystrophic clinical mycotic painful palpation as well as debridement.  Assessment: Pain limb secondary onychomycosis.  Plan: Debridement of toenails 1 through 5 bilateral.

## 2022-02-03 ENCOUNTER — Telehealth (HOSPITAL_COMMUNITY): Payer: Self-pay | Admitting: *Deleted

## 2022-02-03 ENCOUNTER — Other Ambulatory Visit (HOSPITAL_COMMUNITY): Payer: Self-pay | Admitting: Internal Medicine

## 2022-02-03 DIAGNOSIS — R079 Chest pain, unspecified: Secondary | ICD-10-CM

## 2022-02-03 NOTE — Telephone Encounter (Signed)
Close encounter 

## 2022-02-07 ENCOUNTER — Other Ambulatory Visit: Payer: Self-pay

## 2022-02-07 ENCOUNTER — Ambulatory Visit (HOSPITAL_COMMUNITY)
Admission: RE | Admit: 2022-02-07 | Discharge: 2022-02-07 | Disposition: A | Discharge status: Home / Self Care | Payer: Medicare Other | Source: Ambulatory Visit | Attending: Cardiovascular Disease | Admitting: Cardiovascular Disease

## 2022-02-07 DIAGNOSIS — R079 Chest pain, unspecified: Secondary | ICD-10-CM | POA: Insufficient documentation

## 2022-02-07 LAB — EXERCISE TOLERANCE TEST
Estimated workload: 7
Exercise duration (min): 5 min
Exercise duration (sec): 0 s
MPHR: 146 {beats}/min
Peak HR: 139 {beats}/min
Percent HR: 95 %
Rest HR: 85 {beats}/min

## 2022-02-10 ENCOUNTER — Ambulatory Visit: Payer: Self-pay | Admitting: Cardiology

## 2022-02-15 ENCOUNTER — Ambulatory Visit: Payer: Self-pay | Admitting: Cardiology

## 2022-02-16 ENCOUNTER — Encounter: Payer: Self-pay | Admitting: Cardiology

## 2022-02-16 NOTE — Progress Notes (Signed)
?  ?Cardiology Office Note ? ? ?Date:  02/17/2022  ? ?ID:  Bradley Gilmore, DOB 1945/12/20, MRN [id number] ? ?PCP:  Lavone Orn, MD  ?Cardiologist:   Minus Breeding, MD ?Referring:  Johna Roles, PA ? ?Chief Complaint  ?Patient presents with  ? Chest Pain  ? ? ?  ?History of Present Illness: ?Bradley Gilmore is a 76 y.o. adult who is referred by Johna Roles, PA for evaluation of chest pain.  He has not had any prior cardiac work-up although he has had diabetes for 20 years.  He said he has been having chest discomfort for at least 3 to 4 weeks.  He describes some discomfort that was in his left arm and left upper chest but does seem to have resolved.  He has been getting some mid epigastric discomfort.  It seems to start when he is starts walking.  He can kind of walk through it.  It is a mid discomfort.  Is probably moderate.  He also has been getting somewhat short of breath with activity such as making the bed or when he bends over.  He is not describing PND or orthopnea.  He is not describing classic substernal pain, neck discomfort.  He does not notice any palpitations, presyncope or syncope.  He did do a treadmill test recently but he was only able to exercise for about 5 minutes before he reached his target heart rate.  The report said that he stopped because of fatigue but he said he could not get ongoing.  There were no clear ischemic ST-T wave changes although there were some upsloping ST changes. ? ? ?Past Medical History:  ?Diagnosis Date  ? Diabetes mellitus   ? Diverticulitis   ? ? ?Past Surgical History:  ?Procedure Laterality Date  ? APPENDECTOMY    ? CYSTOSCOPY/RETROGRADE/URETEROSCOPY  07/25/2012  ? Procedure: CYSTOSCOPY/RETROGRADE/URETEROSCOPY;  Surgeon: Fredricka Bonine, MD;  Location: [facility];  Service: Urology;  Laterality: Right;  ? TOTAL KNEE ARTHROPLASTY  2011  ? L  ? TOTAL KNEE ARTHROPLASTY Right 08/28/2016  ? Procedure: RIGHT TOTAL KNEE ARTHROPLASTY;  Surgeon: Gaynelle Arabian, MD;  Location: [facility];  Service: Orthopedics;  Laterality: Right;  ? ? ? ?Current Outpatient Medications  ?Medication Sig Dispense Refill  ? diclofenac Sodium (VOLTAREN) 1 % GEL Apply topically.    ? glimepiride (AMARYL) 4 MG tablet Take 4 mg by mouth daily before breakfast.    ? glipiZIDE (GLUCOTROL XL) 10 MG 24 hr tablet Take 10 mg by mouth daily.    ? lidocaine (LIDODERM) 5 % Place onto the skin.    ? metFORMIN (GLUCOPHAGE) 1000 MG tablet Take 1 tablet by mouth 2 (two) times daily.    ? metFORMIN (GLUCOPHAGE-XR) 500 MG 24 hr tablet     ? metoprolol tartrate (LOPRESSOR) 100 MG tablet Take 2 hours prior to CT 1 tablet 0  ? omeprazole (PRILOSEC) 20 MG capsule Take 20 mg by mouth daily.    ? omeprazole (PRILOSEC) 20 MG capsule Take 2 capsules by mouth daily.    ? simvastatin (ZOCOR) 20 MG tablet Take 20 mg by mouth daily.    ? simvastatin (ZOCOR) 20 MG tablet Take 1 tablet by mouth every evening.    ? TRULICITY 5.75 YN/1.8ZF SOPN INJECT 1 PEN SUBCUTANEOUSLY ONCE WEEKLY  3  ? amitriptyline (ELAVIL) 25 MG tablet TAKE ONE TABLET BY MOUTH AT BEDTIME FOR SLEEP AND PAIN; NO DRIVING FOR 8 HOURS    ? Cholecalciferol  25 MCG (1000 UT) tablet Take 1 tablet by mouth daily.    ? famotidine (PEPCID) 20 MG tablet Take 20 mg by mouth daily.    ? pantoprazole (PROTONIX) 40 MG tablet Take 40 mg by mouth daily.    ? ?No current facility-administered medications for this visit.  ? ? ?Allergies:   Patient has no known allergies.  ? ? ?Social History:  The patient  reports that he has never smoked. He has never used smokeless tobacco. He reports that he does not drink alcohol and does not use drugs.  ? ?Family History:  The patient's family history includes Hypertension in his mother; Pulmonary embolism in his mother.  ? ? ?ROS:  Please see the history of present illness.   Otherwise, review of systems are positive for none.   All other systems are reviewed and negative.  ? ? ?PHYSICAL EXAM: ?VS:  BP 128/86   Pulse 88   Ht 5'  11" (1.803 m)   Wt 189 lb 9.6 oz (86 kg)   SpO2 96%   BMI 26.44 kg/m?  , BMI Body mass index is 26.44 kg/m?. ?GENERAL:  Well appearing ?HEENT:  Pupils equal round and reactive, fundi not visualized, oral mucosa unremarkable ?NECK:  No jugular venous distention, waveform within normal limits, carotid upstroke brisk and symmetric, no bruits, no thyromegaly ?LYMPHATICS:  No cervical, inguinal adenopathy ?LUNGS:  Clear to auscultation bilaterally ?BACK:  No CVA tenderness ?CHEST:  Unremarkable ?HEART:  PMI not displaced or sustained,S1 and S2 within normal limits, no S3, no S4, no clicks, no rubs, no murmurs ?ABD:  Flat, positive bowel sounds normal in frequency in pitch, no bruits, no rebound, no guarding, no midline pulsatile mass, no hepatomegaly, no splenomegaly ?EXT:  2 plus pulses throughout, no edema, no cyanosis no clubbing ?SKIN:  No rashes no nodules ?NEURO:  Cranial nerves II through XII grossly intact, motor grossly intact throughout ?PSYCH:  Cognitively intact, oriented to person place and time ? ? ? ?EKG:  EKG is ordered today. ?The ekg ordered today demonstrates sinus rhythm, premature atrial contractions, rate 88, axis within normal limits, intervals within normal limits, unusual P wave axis possibly ectopic atrial arrhythmia. ? ? ?Recent Labs: ?No results found for requested labs within last 8760 hours.  ? ? ?Lipid Panel ?No results found for: CHOL, TRIG, HDL, CHOLHDL, VLDL, LDLCALC, LDLDIRECT ?  ? ?Wt Readings from Last 3 Encounters:  ?02/17/22 189 lb 9.6 oz (86 kg)  ?06/26/19 189 lb 9.6 oz (86 kg)  ?08/28/16 205 lb (93 kg)  ?  ? ? ?Other studies Reviewed: ?Additional studies/ records that were reviewed today include: Labs. ?Review of the above records demonstrates:  Please see elsewhere in the note.   ? ? ?ASSESSMENT AND PLAN: ? ?Precordial chest pain: Given his diabetes I think we need a higher sensitivity of chest to exclude obstructive coronary disease.  I am going to order CT coronary  angiography.  Further evaluation will be based on these results. ? ?DM: A1c is 6.9.  Continue the meds as listed. ? ?Current medicines are reviewed at length with the patient today.  The patient does not have concerns regarding medicines. ? ?The following changes have been made:  no change ? ?Labs/ tests ordered today include:  ? ?Orders Placed This Encounter  ?Procedures  ? CT CORONARY MORPH W/CTA COR W/SCORE W/CA W/CM &/OR WO/CM  ? Basic Metabolic Panel (BMET)  ? EKG 12-Lead  ? ? ? ?Disposition:   FU with  me based on the results of the above or any progressive symptoms. ? ? ?Signed, ?Minus Breeding, MD  ?02/17/2022 3:12 PM    ?[place] ? ? ? ?

## 2022-02-17 ENCOUNTER — Ambulatory Visit (INDEPENDENT_AMBULATORY_CARE_PROVIDER_SITE_OTHER): Payer: Medicare Other | Admitting: Cardiology

## 2022-02-17 ENCOUNTER — Encounter: Payer: Self-pay | Admitting: Cardiology

## 2022-02-17 ENCOUNTER — Other Ambulatory Visit: Payer: Self-pay

## 2022-02-17 VITALS — BP 128/86 | HR 88 | Ht 71.0 in | Wt 189.6 lb

## 2022-02-17 DIAGNOSIS — R072 Precordial pain: Secondary | ICD-10-CM

## 2022-02-17 DIAGNOSIS — R079 Chest pain, unspecified: Secondary | ICD-10-CM

## 2022-02-17 MED ORDER — METOPROLOL TARTRATE 100 MG PO TABS: ORAL_TABLET | ORAL | 0 refills | Status: DC

## 2022-02-17 NOTE — Patient Instructions (Addendum)
Medication Instructions:  ?Your physician recommends that you continue on your current medications as directed. Please refer to the Current Medication list given to you today.  ?*If you need a refill on your cardiac medications before your next appointment, please call your pharmacy* ? ? ?Lab Work: ?Your physician recommends that you return for lab work in:  ?TODAY: BMET ?If you have labs (blood work) drawn today and your tests are completely normal, you will receive your results only by: ?MyChart Message (if you have MyChart) OR ?A paper copy in the mail ?If you have any lab test that is abnormal or we need to change your treatment, we will call you to review the results. ? ? ?Testing/Procedures: ? ? ?Your cardiac CT will be scheduled at one of the below locations:  ? ?[facility] ?[address] ?[place], [place] [address] ?[phone] ? ?If scheduled at [facility], please arrive at the [facility] and Children's Entrance (Entrance C2) of [facility] 30 minutes prior to test start time. ?You can use the FREE valet parking offered at entrance C (encouraged to control the heart rate for the test)  ?Proceed to the [facility] Radiology Department (first floor) to check-in and test prep. ? ?All radiology patients and guests should use entrance C2 at [facility], accessed from [facility], even though the hospital's physical address listed is [address]. ? ? ? ?Please follow these instructions carefully (unless otherwise directed): ? ?Hold all erectile dysfunction medications at least 3 days (72 hrs) prior to test. ? ?On the Night Before the Test: ?Be sure to Drink plenty of water. ?Do not consume any caffeinated/decaffeinated beverages or chocolate 12 hours prior to your test. ?Do not take any antihistamines 12 hours prior to your test. ? ?On the Day of the Test: ?Drink plenty of water until 1 hour prior to the test. ?Do not eat any food 4 hours prior to the  test. ?You may take your regular medications prior to the test.  ?Take metoprolol (Lopressor) two hours prior to test. ? ?After the Test: ?Drink plenty of water. ?After receiving IV contrast, you may experience a mild flushed feeling. This is normal. ?On occasion, you may experience a mild rash up to 24 hours after the test. This is not dangerous. If this occurs, you can take Benadryl 25 mg and increase your fluid intake. ?If you experience trouble breathing, this can be serious. If it is severe call 911 IMMEDIATELY. If it is mild, please call our office. ?If you take any of these medications: Glipizide/Metformin, Avandament, Glucavance, please do not take 48 hours after completing test unless otherwise instructed. ? ?We will call to schedule your test 2-4 weeks out understanding that some insurance companies will need an authorization prior to the service being performed.  ? ?For non-scheduling related questions, please contact the cardiac imaging nurse navigator should you have any questions/concerns: ?Marchia Bond, Cardiac Imaging Nurse Navigator ?Gordy Clement, Cardiac Imaging Nurse Navigator ?[facility] ?Direct Office Dial: [phone]  ? ?For scheduling needs, including cancellations and rescheduling, please call [place], [phone]. ? ? ? ?Follow-Up: ?At [facility], you and your health needs are our priority.  As part of our continuing mission to provide you with exceptional heart care, we have created designated Provider Care Teams.  These Care Teams include your primary Cardiologist (physician) and Advanced Practice Providers (APPs -  Physician Assistants and Nurse Practitioners) who all work together to  provide you with the care you need, when you need it. ? ?We recommend signing up for the patient portal called "MyChart".  Sign up information is provided on this After Visit Summary.  MyChart is used to connect with patients for Virtual Visits (Telemedicine).  Patients  are able to view lab/test results, encounter notes, upcoming appointments, etc.  Non-urgent messages can be sent to your provider as well.   ?To learn more about what you can do with MyChart, go to [email or web address].   ? ?Your next appointment:   ? As needed ? ?The format for your next appointment:   ?In Person ? ?Provider:   ?Minus Breeding, MD   ?  ?

## 2022-02-18 LAB — BASIC METABOLIC PANEL
BUN/Creatinine Ratio: 15 (ref 10–24)
BUN: 22 mg/dL (ref 8–27)
CO2: 24 mmol/L (ref 20–29)
Calcium: 10.1 mg/dL (ref 8.6–10.2)
Chloride: 102 mmol/L (ref 96–106)
Creatinine, Ser: 1.43 mg/dL — ABNORMAL HIGH (ref 0.76–1.27)
Glucose: 155 mg/dL — ABNORMAL HIGH (ref 70–99)
Potassium: 5.3 mmol/L — ABNORMAL HIGH (ref 3.5–5.2)
Sodium: 139 mmol/L (ref 134–144)
eGFR: 51 mL/min/{1.73_m2} — ABNORMAL LOW (ref >59)

## 2022-03-03 ENCOUNTER — Telehealth (HOSPITAL_COMMUNITY): Payer: Self-pay | Admitting: Emergency Medicine

## 2022-03-03 NOTE — Telephone Encounter (Signed)
Reaching out to patient to offer assistance regarding upcoming cardiac imaging study; pt verbalizes understanding of appt date/time, parking situation and where to check in, pre-test NPO status and medications ordered, and verified current allergies; name and call back number provided for further questions should they arise ?Marchia Bond RN Navigator Cardiac Imaging ?[facility] ?[phone] office ?[phone] cell ? ?Denies iv issues (L arm preferred) ?'100mg'$  metoprolol tartrate ?Arrival 130 ? ?

## 2022-03-06 ENCOUNTER — Ambulatory Visit (HOSPITAL_COMMUNITY)
Admission: RE | Admit: 2022-03-06 | Discharge: 2022-03-06 | Disposition: A | Discharge status: Home / Self Care | Payer: Medicare Other | Source: Ambulatory Visit | Attending: Cardiology | Admitting: Cardiology

## 2022-03-06 ENCOUNTER — Other Ambulatory Visit: Payer: Self-pay

## 2022-03-06 DIAGNOSIS — R079 Chest pain, unspecified: Secondary | ICD-10-CM | POA: Diagnosis present

## 2022-03-06 DIAGNOSIS — R072 Precordial pain: Secondary | ICD-10-CM | POA: Insufficient documentation

## 2022-03-06 MED ORDER — METOPROLOL TARTRATE 5 MG/5ML IV SOLN
INTRAVENOUS | Status: AC
  Administered 2022-03-06: 10 mg via INTRAVENOUS
  Filled 2022-03-06: qty 20

## 2022-03-06 MED ORDER — NITROGLYCERIN 0.4 MG SL SUBL
SUBLINGUAL_TABLET | SUBLINGUAL | Status: AC
Start: 2022-03-06 — End: 2022-03-06
  Administered 2022-03-06: 0.8 mg via SUBLINGUAL
  Filled 2022-03-06: qty 2

## 2022-03-06 MED ORDER — IOHEXOL 350 MG/ML SOLN
100.0000 mL | Freq: Once | INTRAVENOUS | Status: AC | PRN
  Administered 2022-03-06: 100 mL via INTRAVENOUS

## 2022-03-06 MED ORDER — METOPROLOL TARTRATE 5 MG/5ML IV SOLN
10.0000 mg | INTRAVENOUS | Status: DC | PRN
Start: 2022-03-06 — End: 2022-03-07
  Administered 2022-03-06: 10 mg via INTRAVENOUS

## 2022-03-06 MED ORDER — NITROGLYCERIN 0.4 MG SL SUBL: 0.8000 mg | SUBLINGUAL_TABLET | Freq: Once | SUBLINGUAL | Status: AC

## 2022-03-07 ENCOUNTER — Telehealth: Payer: Self-pay | Admitting: *Deleted

## 2022-03-07 NOTE — Telephone Encounter (Signed)
-----   Message from Minus Breeding, MD sent at 03/07/2022  7:47 AM EDT ----- ?He had some moderate LAD disease but this does not appear to be severe.   I would like to see him back in about 2 months to see if there are any other symptoms to consider any further testing that is needed.  At this point no change in therapy. Call Mr. Kirtz with the results and send results to Lavone Orn, MD ? ? ?

## 2022-03-07 NOTE — Telephone Encounter (Signed)
Left message for pt to call.

## 2022-03-07 NOTE — Telephone Encounter (Signed)
Spoke with pt and his wife about the results and Dr. Rosezella [place] recommendations. They verbalized understanding. Pt added to schedule for 5/25. No further questions at this time, he thanked me for calling him back.  ?

## 2022-03-07 NOTE — Telephone Encounter (Signed)
Patient returned call

## 2022-03-16 ENCOUNTER — Ambulatory Visit (INDEPENDENT_AMBULATORY_CARE_PROVIDER_SITE_OTHER): Payer: Medicare Other | Admitting: Podiatry

## 2022-03-16 DIAGNOSIS — D2372 Other benign neoplasm of skin of left lower limb, including hip: Secondary | ICD-10-CM | POA: Diagnosis not present

## 2022-03-16 DIAGNOSIS — D2371 Other benign neoplasm of skin of right lower limb, including hip: Secondary | ICD-10-CM | POA: Diagnosis not present

## 2022-03-16 DIAGNOSIS — B351 Tinea unguium: Secondary | ICD-10-CM | POA: Diagnosis not present

## 2022-03-16 DIAGNOSIS — E1142 Type 2 diabetes mellitus with diabetic polyneuropathy: Secondary | ICD-10-CM

## 2022-03-16 DIAGNOSIS — M79676 Pain in unspecified toe(s): Secondary | ICD-10-CM

## 2022-03-16 NOTE — Progress Notes (Signed)
He presents today for a chief complaint of painful nails bilateral. ? ?Objective: Toenails are long thick yellow dystrophic clinically mycotic maintains palpable pulses bilateral. ? ?Assessment: Diabetes mellitus with diabetic peripheral neuropathy hammertoe deformities painful elongated toenails. ? ?Plan: Debrided toenails 1 through 5. ? ?Plan ?Bradley Gilmore schedule for diabetic shoes ?

## 2022-05-08 ENCOUNTER — Ambulatory Visit: Payer: Medicare Other | Admitting: Cardiology

## 2022-05-10 NOTE — Progress Notes (Unsigned)
Cardiology Office Note   Date:  05/11/2022   ID:  Bradley Gilmore, DOB 1946/01/31, MRN [id number]  PCP:  Lavone Orn, MD  Cardiologist:   Minus Breeding, MD Referring:  Lavone Orn, MD  Chief Complaint  Patient presents with   Chest Pain      History of Present Illness: Bradley Gilmore is a 76 y.o. male who was referred by Lavone Orn, MD for evaluation of chest pain.  I sent him for coronary CT and he had mild calcium and moderate CAD with 50 - 69% stenosis in the LAD.  Since I last saw him he has had no new chest discomfort such as he was describing previously.  He does do some activities and he says he can push a lawnmower but his wife says he actually was relatively slowly and he does admit to this.  He says he has been having some sciatic problems which is limiting him.  Is not getting any new shortness of breath, chest pressure, neck or arm discomfort.  Not having any palpitations, presyncope or syncope.  He has had no weight gain or edema.   Past Medical History:  Diagnosis Date   Diabetes mellitus    Diverticulitis     Past Surgical History:  Procedure Laterality Date   APPENDECTOMY     CYSTOSCOPY/RETROGRADE/URETEROSCOPY  07/25/2012   Procedure: CYSTOSCOPY/RETROGRADE/URETEROSCOPY;  Surgeon: Fredricka Bonine, MD;  Location: [facility];  Service: Urology;  Laterality: Right;   TOTAL KNEE ARTHROPLASTY  2011   L   TOTAL KNEE ARTHROPLASTY Right 08/28/2016   Procedure: RIGHT TOTAL KNEE ARTHROPLASTY;  Surgeon: Gaynelle Arabian, MD;  Location: [facility];  Service: Orthopedics;  Laterality: Right;     Current Outpatient Medications  Medication Sig Dispense Refill   aspirin EC 81 MG tablet Take 81 mg by mouth daily. Swallow whole.     Cholecalciferol 25 MCG (1000 UT) tablet Take 1 tablet by mouth daily.     diclofenac Sodium (VOLTAREN) 1 % GEL Apply topically.     glimepiride (AMARYL) 4 MG tablet Take 4 mg by mouth daily before breakfast.     glipiZIDE (GLUCOTROL XL)  10 MG 24 hr tablet Take 10 mg by mouth daily.     lidocaine (LIDODERM) 5 % Place onto the skin.     metFORMIN (GLUCOPHAGE-XR) 500 MG 24 hr tablet      pantoprazole (PROTONIX) 40 MG tablet Take 40 mg by mouth daily.     SIMVASTATIN PO Take by mouth daily.     TRULICITY 3.14 HF/0.2OV SOPN INJECT 1 PEN SUBCUTANEOUSLY ONCE WEEKLY  3   nitroGLYCERIN (NITROSTAT) 0.3 MG SL tablet nitroglycerin 0.3 mg sublingual tablet  PLEASE SEE ATTACHED FOR DETAILED DIRECTIONS     No current facility-administered medications for this visit.    Allergies:   Patient has no known allergies.    ROS:  Please see the history of present illness.   Otherwise, review of systems are positive for none.   All other systems are reviewed and negative.    PHYSICAL EXAM: VS:  BP 124/70   Pulse 82   Ht 6' (1.829 m)   Wt 190 lb 9.6 oz (86.5 kg)   SpO2 99%   BMI 25.85 kg/m  , BMI Body mass index is 25.85 kg/m. GENERAL:  Well appearing NECK:  No jugular venous distention, waveform within normal limits, carotid upstroke brisk and symmetric, no bruits, no thyromegaly LUNGS:  Clear to auscultation bilaterally CHEST:  Unremarkable HEART:  PMI not displaced or sustained,S1 and S2 within normal limits, no S3, no S4, no clicks, no rubs, no murmurs ABD:  Flat, positive bowel sounds normal in frequency in pitch, no bruits, no rebound, no guarding, no midline pulsatile mass, no hepatomegaly, no splenomegaly EXT:  2 plus pulses throughout, no edema, no cyanosis no clubbing   EKG:  EKG is  ordered today. The ekg ordered today demonstrates sinus rhythm, premature atrial contractions, rate 82, axis within normal limits, intervals within normal limits, unusual P wave axis possibly ectopic atrial arrhythmia.   Recent Labs: 02/17/2022: BUN 22; Creatinine, Ser 1.43; Potassium 5.3; Sodium 139    Lipid Panel No results found for: CHOL, TRIG, HDL, CHOLHDL, VLDL, LDLCALC, LDLDIRECT    Wt Readings from Last 3 Encounters:  05/11/22  190 lb 9.6 oz (86.5 kg)  02/17/22 189 lb 9.6 oz (86 kg)  06/26/19 189 lb 9.6 oz (86 kg)      Other studies Reviewed: Additional studies/ records that were reviewed today include: None. Review of the above records demonstrates:  Please see elsewhere in the note.     ASSESSMENT AND PLAN:  Precordial chest pain:   Given the previous chest discomfort and the fact that he is not exerting himself significantly to the degree I would like, the equivocal result on a POET (Plain Old Exercise Treadmill) and our inability to do an FFR he needs further testing and so I will order Lexiscan Myoview.   We discussed risk reduction in detail.  DM: A1c is up to 7.6.  He is having this managed by his primary provider and we did discuss diet which he is not strict about.   Dyslipidemia: LDL 63 and HDL 37.  He is on Zocor and he will continue this.  Current medicines are reviewed at length with the patient today.  The patient does not have concerns regarding medicines.  The following changes have been made: None  Labs/ tests ordered today include:   Orders Placed This Encounter  Procedures   Cardiac Stress Test: Informed Consent Details: Physician/Practitioner Attestation; Transcribe to consent form and obtain patient signature   MYOCARDIAL PERFUSION IMAGING   EKG 12-Lead     Disposition:   FU with me 1 year based on the results above.   Signed, Minus Breeding, MD  05/11/2022 3:47 PM    [facility]

## 2022-05-11 ENCOUNTER — Encounter: Payer: Self-pay | Admitting: Cardiology

## 2022-05-11 ENCOUNTER — Ambulatory Visit: Payer: Medicare Other | Admitting: Cardiology

## 2022-05-11 ENCOUNTER — Ambulatory Visit (INDEPENDENT_AMBULATORY_CARE_PROVIDER_SITE_OTHER): Payer: Medicare Other | Admitting: Cardiology

## 2022-05-11 VITALS — BP 124/70 | HR 82 | Ht 72.0 in | Wt 190.6 lb

## 2022-05-11 DIAGNOSIS — R072 Precordial pain: Secondary | ICD-10-CM

## 2022-05-11 DIAGNOSIS — E118 Type 2 diabetes mellitus with unspecified complications: Secondary | ICD-10-CM | POA: Diagnosis not present

## 2022-05-11 NOTE — Patient Instructions (Addendum)
Medication Instructions:  No changes   *If you need a refill on your cardiac medications before your next appointment, please call your pharmacy*   Lab Work:  Not needed.   Testing/Procedures:  Will be schedule at [place] has requested that you have a lexiscan myoview. Please follow instruction sheet, as given.    Follow-Up: At [facility], you and your health needs are our priority.  As part of our continuing mission to provide you with exceptional heart care, we have created designated Provider Care Teams.  These Care Teams include your primary Cardiologist (physician) and Advanced Practice Providers (APPs -  Physician Assistants and Nurse Practitioners) who all work together to provide you with the care you need, when you need it.     Your next appointment:   12 month(s)  The format for your next appointment:   In Person  Provider:   Minus Breeding, MD   Other instruction  [phone]   Your doctor has scheduled you for a Myocardial Perfusion scan to obtain information about the blood flow to your heart. The test consists of taking pictures of your heart in two phases: while resting and after a stress test.  The stress test may involve walking on a treadmill, or if you are unable to exercise adequately, you will be given a drug intended to have a similar effect on the heart to that of exercise.  The test will take approximately 3 to 4  hours to complete.  If you are pregnant or breastfeeding,  please notify the staff prior to your test.  How to prepare for your test: Do not eat or drink 2 hours prior to your test Do not consume products containing caffeine 12 hours prior to your test (examples: coffee (regular OR decaf), chocolate, sodas, tea) Your doctor may need you to hold certain medications prior to the test.  If so, these are listed below and should not be taken for 24 hours prior to the test.  If not listed below, you may take  your medications as normal.  You may resume taking held medications on your normal schedule once the test is complete.   Meds to hold: not needed Do bring a list of your current medications with you.  If you have held any meds in preparation for the test, please bring them, as you may be required to take them once the test is completed. Do wear comfortable clothes and walking shoes.  Do not wear dresses or overalls. Do NOT wear cologne, perfume, aftershave, or fragranced lotions the day of your test (deodorants okay). If these instructions are not followed your test will have to be rescheduled.   A nuclear cardiologist will review your test, prepare a report and send it to your physician.   If you have questions or concerns about your appointment, you can call the Nuclear Cardiology department at [phone]. If you cannot keep your appointment, please provide 48 hours notification to avoid a possible $50.00 charge to your account.   Please arrive 15 minutes prior to your appointment time for registration and insurance purposes

## 2022-05-12 ENCOUNTER — Telehealth: Payer: Self-pay | Admitting: Cardiology

## 2022-05-12 NOTE — Telephone Encounter (Signed)
Attempted to call patient, left message for patient to call back to office.   Will update medication chart and send to MD to make aware.   Simvastatin '20mg'$  once daily added to patient's medications.

## 2022-05-12 NOTE — Telephone Encounter (Signed)
Pt c/o medication issue:  1. Name of Medication: simvastatin 20 mg  2. How are you currently taking this medication (dosage and times per day)? 1 tablet daily, in the evening   3. Are you having a reaction (difficulty breathing--STAT)? no  4. What is your medication issue? Patient states he was told to call in to inform the office he takes simvastatin so it can be updated on his chart.

## 2022-05-12 NOTE — Telephone Encounter (Signed)
Patient returning call to [place]

## 2022-05-12 NOTE — Telephone Encounter (Signed)
Spoke with patient and advised him that I forwarded message to Dr. Percival Spanish to make him aware of simvastatin dosage, and that chart has been updated. Patient verbalized understanding.   Advised patient to call back to office with any issues, questions, or concerns. Patient verbalized understanding.

## 2022-05-23 ENCOUNTER — Telehealth (HOSPITAL_COMMUNITY): Payer: Self-pay | Admitting: *Deleted

## 2022-05-23 NOTE — Telephone Encounter (Signed)
Close encounter 

## 2022-05-24 ENCOUNTER — Ambulatory Visit (HOSPITAL_COMMUNITY)
Admission: RE | Admit: 2022-05-24 | Discharge: 2022-05-24 | Disposition: A | Discharge status: Home / Self Care | Payer: Medicare Other | Source: Ambulatory Visit | Attending: Cardiology | Admitting: Cardiology

## 2022-05-24 DIAGNOSIS — R072 Precordial pain: Secondary | ICD-10-CM | POA: Diagnosis not present

## 2022-05-24 DIAGNOSIS — E118 Type 2 diabetes mellitus with unspecified complications: Secondary | ICD-10-CM | POA: Diagnosis not present

## 2022-05-24 LAB — MYOCARDIAL PERFUSION IMAGING
LV dias vol: 100 mL (ref 62–150)
LV sys vol: 46 mL
Nuc Stress EF: 53 %
Peak HR: 98 {beats}/min
Rest HR: 70 {beats}/min
Rest Nuclear Isotope Dose: 10.4 mCi
SDS: 2
SRS: 1
SSS: 3
ST Depression (mm): 0 mm
Stress Nuclear Isotope Dose: 32 mCi
TID: 1.07

## 2022-05-24 MED ORDER — TECHNETIUM TC 99M TETROFOSMIN IV KIT
32.0000 | PACK | Freq: Once | INTRAVENOUS | Status: AC | PRN
  Administered 2022-05-24: 32 via INTRAVENOUS

## 2022-05-24 MED ORDER — TECHNETIUM TC 99M TETROFOSMIN IV KIT
10.4000 | PACK | Freq: Once | INTRAVENOUS | Status: AC | PRN
  Administered 2022-05-24: 10.4 via INTRAVENOUS

## 2022-05-24 MED ORDER — REGADENOSON 0.4 MG/5ML IV SOLN
0.4000 mg | Freq: Once | INTRAVENOUS | Status: AC
  Administered 2022-05-24: 0.4 mg via INTRAVENOUS

## 2022-05-31 ENCOUNTER — Encounter: Payer: Self-pay | Admitting: *Deleted

## 2022-06-01 ENCOUNTER — Ambulatory Visit (INDEPENDENT_AMBULATORY_CARE_PROVIDER_SITE_OTHER): Payer: Medicare Other | Admitting: Podiatry

## 2022-06-01 DIAGNOSIS — E1142 Type 2 diabetes mellitus with diabetic polyneuropathy: Secondary | ICD-10-CM

## 2022-06-01 DIAGNOSIS — M79676 Pain in unspecified toe(s): Secondary | ICD-10-CM

## 2022-06-01 DIAGNOSIS — B351 Tinea unguium: Secondary | ICD-10-CM | POA: Diagnosis not present

## 2022-06-02 NOTE — Progress Notes (Signed)
Chief complaint of painful elongated toenails.  Objective: Toenails are long thick yellow dystrophic clinical mycotic painful palpation as well as debridement.  Assessment: Pain limb secondary onychomycosis.  Plan: Debridement of toenails 1 through 5 bilateral.

## 2022-06-15 ENCOUNTER — Ambulatory Visit: Payer: Medicare Other | Admitting: Podiatry

## 2022-06-26 ENCOUNTER — Ambulatory Visit (INDEPENDENT_AMBULATORY_CARE_PROVIDER_SITE_OTHER): Payer: Medicare Other

## 2022-06-26 DIAGNOSIS — E1142 Type 2 diabetes mellitus with diabetic polyneuropathy: Secondary | ICD-10-CM

## 2022-06-26 DIAGNOSIS — I739 Peripheral vascular disease, unspecified: Secondary | ICD-10-CM

## 2022-06-26 NOTE — Progress Notes (Signed)
Reason for Consult: Evaluation for Prefabricated Diabetic Shoes and Custom Diabetic Inserts.  Physician Treating Diabetes:               ORTHOTIC RECOMMENDATION Recommended Devices: - 1 pair of diabetic shoes; --- Patient Selected [employer]THOF[employer] 618(10 WIDE AND 10.5 WIDE(will see which one feels better on bunion and return the other.) - 3 pair custom-to-patient vacuum formed diabetic insoles  ACTIONS PERFORMED Potential out of pocket cost was communicated to patient. Patient understood and consented to measurement and casting. Patient was casted for insoles via crush box and measured for shoes. dressed. Casts shipped to central fabrication for HOLD until Josem Kaufmann is received from the [place] [facility] [place] are to be ordered and casts released from hold once all appropriate paperwork is complete. Patient is to be contacted and scheduled for fitting once shoes and insoles have been fabricated and received.

## 2022-07-25 ENCOUNTER — Telehealth: Payer: Self-pay | Admitting: Podiatry

## 2022-07-25 NOTE — Telephone Encounter (Signed)
Pt calling to check on the status of his diabetic shoes. He states that the authorization was to be sent to the [place] for coverage. Pt states he has left several messages on machine and I explained the departmental transition and apologized for the delay.   Please advise and contact patient with status.

## 2022-08-09 ENCOUNTER — Encounter (INDEPENDENT_AMBULATORY_CARE_PROVIDER_SITE_OTHER): Payer: Medicare Other | Admitting: Ophthalmology

## 2022-08-09 DIAGNOSIS — H353112 Nonexudative age-related macular degeneration, right eye, intermediate dry stage: Secondary | ICD-10-CM

## 2022-08-09 DIAGNOSIS — E113391 Type 2 diabetes mellitus with moderate nonproliferative diabetic retinopathy without macular edema, right eye: Secondary | ICD-10-CM | POA: Diagnosis not present

## 2022-08-09 DIAGNOSIS — H43813 Vitreous degeneration, bilateral: Secondary | ICD-10-CM

## 2022-08-09 DIAGNOSIS — E113292 Type 2 diabetes mellitus with mild nonproliferative diabetic retinopathy without macular edema, left eye: Secondary | ICD-10-CM

## 2022-08-09 DIAGNOSIS — H353121 Nonexudative age-related macular degeneration, left eye, early dry stage: Secondary | ICD-10-CM | POA: Diagnosis not present

## 2022-08-10 ENCOUNTER — Ambulatory Visit: Payer: Medicare Other | Admitting: Podiatry

## 2022-08-14 ENCOUNTER — Ambulatory Visit: Payer: Medicare Other | Admitting: Podiatry

## 2022-08-15 ENCOUNTER — Ambulatory Visit (INDEPENDENT_AMBULATORY_CARE_PROVIDER_SITE_OTHER): Payer: Medicare Other | Admitting: Podiatry

## 2022-08-15 DIAGNOSIS — D2372 Other benign neoplasm of skin of left lower limb, including hip: Secondary | ICD-10-CM

## 2022-08-15 DIAGNOSIS — M79676 Pain in unspecified toe(s): Secondary | ICD-10-CM | POA: Diagnosis not present

## 2022-08-15 DIAGNOSIS — D2371 Other benign neoplasm of skin of right lower limb, including hip: Secondary | ICD-10-CM

## 2022-08-15 DIAGNOSIS — M2041 Other hammer toe(s) (acquired), right foot: Secondary | ICD-10-CM

## 2022-08-15 DIAGNOSIS — L84 Corns and callosities: Secondary | ICD-10-CM

## 2022-08-15 DIAGNOSIS — B351 Tinea unguium: Secondary | ICD-10-CM

## 2022-08-15 DIAGNOSIS — M2042 Other hammer toe(s) (acquired), left foot: Secondary | ICD-10-CM

## 2022-08-15 DIAGNOSIS — E1142 Type 2 diabetes mellitus with diabetic polyneuropathy: Secondary | ICD-10-CM

## 2022-08-15 DIAGNOSIS — M2011 Hallux valgus (acquired), right foot: Secondary | ICD-10-CM

## 2022-08-15 DIAGNOSIS — I739 Peripheral vascular disease, unspecified: Secondary | ICD-10-CM

## 2022-08-15 NOTE — Progress Notes (Signed)
Chief complaint of painful elongated toenails and also needs diabetic shoes and insoles.   Objective: Toenails are long thick yellow dystrophic clinical mycotic painful palpation as well as debridement.  Assessment: Pain limb secondary onychomycosis. Diabetic with neuropathy, hammer toe and bunion deformities.  Plan: Debridement of toenails 1 through 5 bilateral. Prescription for shoes and inserts will be sent to the [place] and a copy given to the patient.

## 2022-11-02 ENCOUNTER — Ambulatory Visit (INDEPENDENT_AMBULATORY_CARE_PROVIDER_SITE_OTHER): Payer: Medicare Other | Admitting: *Deleted

## 2022-11-02 DIAGNOSIS — M2011 Hallux valgus (acquired), right foot: Secondary | ICD-10-CM

## 2022-11-02 DIAGNOSIS — E1142 Type 2 diabetes mellitus with diabetic polyneuropathy: Secondary | ICD-10-CM

## 2022-11-02 DIAGNOSIS — M2041 Other hammer toe(s) (acquired), right foot: Secondary | ICD-10-CM | POA: Diagnosis not present

## 2022-11-02 DIAGNOSIS — M2042 Other hammer toe(s) (acquired), left foot: Secondary | ICD-10-CM

## 2022-11-02 NOTE — Progress Notes (Signed)
Patient presents today to pick up diabetic shoe prescribed by Dr. Milinda Pointer.   Diabetic shoes and inserts were dispensed and fit was satisfactory. Reviewed instructions for break-in and wear. Written instructions given to patient.  Patient will follow up as needed.   [facility] - order # [id number]

## 2022-11-07 ENCOUNTER — Telehealth: Payer: Self-pay | Admitting: Podiatry

## 2022-11-07 NOTE — Telephone Encounter (Signed)
Patient called the shoes are uncomfortable and putting pressure on his big toe, he is going to return the shoes 11/08/22 and get a pair from the [place].  Did not want to do the exchange;

## 2022-11-08 NOTE — Telephone Encounter (Signed)
Shoes are able to be returned and charges will need to be voided. Pt is aware that he will still be responsible for his custom inserts.

## 2022-11-14 ENCOUNTER — Encounter: Payer: Self-pay | Admitting: Podiatry

## 2022-11-14 ENCOUNTER — Ambulatory Visit (INDEPENDENT_AMBULATORY_CARE_PROVIDER_SITE_OTHER): Payer: Medicare Other | Admitting: Podiatry

## 2022-11-14 DIAGNOSIS — D2371 Other benign neoplasm of skin of right lower limb, including hip: Secondary | ICD-10-CM | POA: Diagnosis not present

## 2022-11-14 DIAGNOSIS — D2372 Other benign neoplasm of skin of left lower limb, including hip: Secondary | ICD-10-CM | POA: Diagnosis not present

## 2022-11-14 DIAGNOSIS — E1142 Type 2 diabetes mellitus with diabetic polyneuropathy: Secondary | ICD-10-CM | POA: Diagnosis not present

## 2022-11-14 DIAGNOSIS — M79676 Pain in unspecified toe(s): Secondary | ICD-10-CM

## 2022-11-14 DIAGNOSIS — B351 Tinea unguium: Secondary | ICD-10-CM | POA: Diagnosis not present

## 2022-11-14 NOTE — Progress Notes (Signed)
He presents today chief complaint of painful elongated toenails with his normal jovial regimen of jokes.  Objective: Pulses remain palpable hammertoe deformities bilateral dystrophic nails thick yellow dystrophic clinically mycotic subungual debris tender on palpation benign skin lesions medial and plantar medial aspect of the foot forefoot bilateral.  No open lesions or wounds.  Assessment: Pain limb secondary to hammertoe deformities diabetic peripheral neuropathy benign skin lesions and toenails bilateral.  Plan: Debrided benign skin lesions debrided toenails 1 through 5 bilateral follow-up with him in 3 months

## 2022-12-21 ENCOUNTER — Ambulatory Visit (INDEPENDENT_AMBULATORY_CARE_PROVIDER_SITE_OTHER): Payer: Medicare Other | Admitting: Podiatry

## 2022-12-21 ENCOUNTER — Encounter: Payer: Self-pay | Admitting: Podiatry

## 2022-12-21 DIAGNOSIS — M7751 Other enthesopathy of right foot: Secondary | ICD-10-CM | POA: Diagnosis not present

## 2022-12-21 MED ORDER — TRIAMCINOLONE ACETONIDE 10 MG/ML IJ SUSP
10.0000 mg | Freq: Once | INTRAMUSCULAR | Status: AC
  Administered 2022-12-21: 10 mg

## 2022-12-21 MED ORDER — TRIAMCINOLONE ACETONIDE 10 MG/ML IJ SUSP: 10.0000 mg | Freq: Once | INTRAMUSCULAR | Status: AC

## 2022-12-22 NOTE — Progress Notes (Signed)
Subjective:   Patient ID: Bradley Gilmore, male   DOB: 77 y.o.   MRN: [id number]   HPI Patient presents stating he is developed a lot of pain around the joint of his right foot and it is hard for him to walk on comfortably at this time   ROS      Objective:  Physical Exam  Neurovascular status intact with inflammation pain of the second MPJ right fluid buildup around the joint history of structural digital deformities     Assessment:  Inflammatory capsulitis of the second MPJ right with fluid buildup around the joint     Plan:  H&P reviewed condition sterile prep and injected around the second MPJ in a periarticular fashion 3 mg dexamethasone Kenalog 5 mg Xylocaine and advised on rigid bottom shoes and reappoint to recheck

## 2023-02-13 ENCOUNTER — Ambulatory Visit (INDEPENDENT_AMBULATORY_CARE_PROVIDER_SITE_OTHER): Payer: Medicare Other | Admitting: Podiatry

## 2023-02-13 DIAGNOSIS — D2371 Other benign neoplasm of skin of right lower limb, including hip: Secondary | ICD-10-CM | POA: Diagnosis not present

## 2023-02-13 DIAGNOSIS — B351 Tinea unguium: Secondary | ICD-10-CM

## 2023-02-13 DIAGNOSIS — D2372 Other benign neoplasm of skin of left lower limb, including hip: Secondary | ICD-10-CM

## 2023-02-13 DIAGNOSIS — M79676 Pain in unspecified toe(s): Secondary | ICD-10-CM | POA: Diagnosis not present

## 2023-02-13 DIAGNOSIS — E1142 Type 2 diabetes mellitus with diabetic polyneuropathy: Secondary | ICD-10-CM

## 2023-02-13 DIAGNOSIS — M7751 Other enthesopathy of right foot: Secondary | ICD-10-CM | POA: Diagnosis not present

## 2023-02-13 MED ORDER — TRIAMCINOLONE ACETONIDE 40 MG/ML IJ SUSP
20.0000 mg | Freq: Once | INTRAMUSCULAR | Status: AC
  Administered 2023-02-13: 20 mg

## 2023-02-13 NOTE — Progress Notes (Signed)
He presents today for follow-up of his diabetic footcare.  He states that he needs his nails trimmed the callus trimmed and he is hoping for an injection again in the top of his right foot stating that it did help with the toes after Dr. Paulla Dolly injected him last time.  Objective: Vital signs are stable alert and oriented x 3.  Pulses are palpable.  There is no erythema edema salines drainage or odor severe hallux valgus deformity and severe rigid hammertoe deformities resulting in capsulitis of the second third metatarsophalangeal joint areas.  Toenails are long thick yellow dystrophic onychomycotic painful incurvated and painful on palpation.  Maintains decrease in sensorium.  Benign skin lesions to the plantar and plantar medial aspect of the interphalangeal joint and of the first metatarsophalangeal joint bilateral.  Assessment: Pain in limb secondary to onychomycosis benign skin lesions diabetic peripheral neuropathy as well as capsulitis and digital deformities.  Plan: Reinjected around the second and third metatarsophalangeal joints today 20 mg of Kenalog and local anesthetic distributed amongst the 2.  I debrided the toenails 1 through 5 bilateral debrided benign skin lesions.  Would like to follow-up with him in about 3 months prior to hit he and his wife going to [place].

## 2023-04-24 DIAGNOSIS — J029 Acute pharyngitis, unspecified: Secondary | ICD-10-CM | POA: Insufficient documentation

## 2023-04-24 DIAGNOSIS — H9201 Otalgia, right ear: Secondary | ICD-10-CM | POA: Insufficient documentation

## 2023-05-03 ENCOUNTER — Encounter: Payer: Self-pay | Admitting: Podiatry

## 2023-05-03 ENCOUNTER — Ambulatory Visit (INDEPENDENT_AMBULATORY_CARE_PROVIDER_SITE_OTHER): Payer: Medicare Other | Admitting: Podiatry

## 2023-05-03 DIAGNOSIS — L03031 Cellulitis of right toe: Secondary | ICD-10-CM | POA: Diagnosis not present

## 2023-05-03 DIAGNOSIS — M79676 Pain in unspecified toe(s): Secondary | ICD-10-CM

## 2023-05-03 DIAGNOSIS — M7751 Other enthesopathy of right foot: Secondary | ICD-10-CM

## 2023-05-03 DIAGNOSIS — D2372 Other benign neoplasm of skin of left lower limb, including hip: Secondary | ICD-10-CM | POA: Diagnosis not present

## 2023-05-03 DIAGNOSIS — D2371 Other benign neoplasm of skin of right lower limb, including hip: Secondary | ICD-10-CM | POA: Diagnosis not present

## 2023-05-03 DIAGNOSIS — E1142 Type 2 diabetes mellitus with diabetic polyneuropathy: Secondary | ICD-10-CM | POA: Diagnosis not present

## 2023-05-03 DIAGNOSIS — B351 Tinea unguium: Secondary | ICD-10-CM

## 2023-05-03 MED ORDER — DOXYCYCLINE HYCLATE 100 MG PO TABS: 100.0000 mg | ORAL_TABLET | Freq: Two times a day (BID) | ORAL | 1 refills | Status: DC

## 2023-05-03 NOTE — Progress Notes (Signed)
He presents today chief complaint of painful elongated toenails and calluses bilateral foot.  Is also complaining of a painful area to the tibial border of the hallux right states it hurts all the way back under here stating that there was some early drainage.  He states that it hurts right here in this fold as he refers to a callus to the medial aspect of the IP joint hallux right.  Objective: Toenails are long thick yellow dystrophic with mycotic sharply incurvated painful.  Hallux valgus resulting in reactive hyper keratoma to the medial aspect of the right hallux.  There appears to be mild erythema around the medial border of the hallux nail plate consistent with a paronychia.  Otherwise are benign skin lesions reactive in nature plantar aspect of bilateral foot and forefoot.  Assessment: Pain limb secondary to onychomycosis benign skin lesions and mild paronychia.  Plan: Doxycycline for the paronychia I debrided all benign skin lesions and debrided toenails 1 through 5 bilaterally.  I will follow-up with him in 3 weeks prior to him leaving for [place].

## 2023-05-22 ENCOUNTER — Ambulatory Visit: Payer: Medicare Other | Admitting: Podiatry

## 2023-05-24 ENCOUNTER — Ambulatory Visit: Payer: Medicare Other | Admitting: Podiatry

## 2023-06-27 ENCOUNTER — Ambulatory Visit: Payer: Medicare Other | Admitting: Podiatry

## 2023-07-02 ENCOUNTER — Ambulatory Visit (INDEPENDENT_AMBULATORY_CARE_PROVIDER_SITE_OTHER): Payer: Medicare Other | Admitting: Podiatry

## 2023-07-02 DIAGNOSIS — M79676 Pain in unspecified toe(s): Secondary | ICD-10-CM

## 2023-07-02 DIAGNOSIS — B351 Tinea unguium: Secondary | ICD-10-CM

## 2023-07-02 DIAGNOSIS — L03032 Cellulitis of left toe: Secondary | ICD-10-CM | POA: Insufficient documentation

## 2023-07-02 DIAGNOSIS — E1142 Type 2 diabetes mellitus with diabetic polyneuropathy: Secondary | ICD-10-CM

## 2023-07-02 MED ORDER — DOXYCYCLINE HYCLATE 100 MG PO TABS: 100.0000 mg | ORAL_TABLET | Freq: Two times a day (BID) | ORAL | 1 refills | Status: DC

## 2023-07-02 NOTE — Progress Notes (Signed)
This patient returns to my office for at risk foot care.  This patient requires this care by a professional since this patient will be at risk due to having diabetes.  He was treated for infection right toenail.This patient is unable to cut nails himself since the patient cannot reach his nails.These nails are painful walking and wearing shoes.  This patient presents for at risk foot care today.  General Appearance  Alert, conversant and in no acute stress.  Vascular  Dorsalis pedis and posterior tibial  pulses are palpable  bilaterally.  Capillary return is within normal limits  bilaterally. Temperature is within normal limits  bilaterally.  Neurologic  Senn-Weinstein monofilament wire test within normal limits  bilaterally. Muscle power within normal limits bilaterally.  Nails Thick disfigured discolored nails with subungual debris  from hallux to fifth toes bilaterally. No evidence of bacterial infection or drainage bilaterally.  Orthopedic  No limitations of motion  feet .  No crepitus or effusions noted.  No bony pathology or digital deformities noted.  Skin  normotropic skin with no porokeratosis noted bilaterally.  No signs of infections or ulcers noted.     Onychomycosis  Pain in right toes  Pain in left toes  Consent was obtained for treatment procedures.   Mechanical debridement of nails 1-5  bilaterally performed with a nail nipper.  Filed with dremel without incident. Upon debridement of his nails there was a subungual infection third toenail left foot.   Neosporin/DSD  peroxide at home.  Then neosporin/DSD.  Prescribe doxycycline.   Return office visit     10 weeks                 Told patient to return for periodic foot care and evaluation due to potential at risk complications.   Helane Gunther DPM

## 2023-07-31 ENCOUNTER — Ambulatory Visit: Payer: Medicare Other | Admitting: Podiatry

## 2023-08-10 ENCOUNTER — Encounter (INDEPENDENT_AMBULATORY_CARE_PROVIDER_SITE_OTHER): Payer: Medicare Other | Admitting: Ophthalmology

## 2023-08-16 DIAGNOSIS — H6501 Acute serous otitis media, right ear: Secondary | ICD-10-CM | POA: Insufficient documentation

## 2023-08-30 ENCOUNTER — Encounter: Payer: Self-pay | Admitting: Podiatry

## 2023-08-30 ENCOUNTER — Ambulatory Visit (INDEPENDENT_AMBULATORY_CARE_PROVIDER_SITE_OTHER): Payer: Medicare Other | Admitting: Podiatry

## 2023-08-30 DIAGNOSIS — D2372 Other benign neoplasm of skin of left lower limb, including hip: Secondary | ICD-10-CM | POA: Diagnosis not present

## 2023-08-30 DIAGNOSIS — B351 Tinea unguium: Secondary | ICD-10-CM | POA: Diagnosis not present

## 2023-08-30 DIAGNOSIS — D2371 Other benign neoplasm of skin of right lower limb, including hip: Secondary | ICD-10-CM

## 2023-08-30 DIAGNOSIS — M79676 Pain in unspecified toe(s): Secondary | ICD-10-CM

## 2023-08-30 DIAGNOSIS — E1142 Type 2 diabetes mellitus with diabetic polyneuropathy: Secondary | ICD-10-CM | POA: Diagnosis not present

## 2023-08-30 DIAGNOSIS — L03031 Cellulitis of right toe: Secondary | ICD-10-CM

## 2023-09-02 NOTE — Progress Notes (Signed)
He presents today for follow-up of his paronychia of his toe.  He states that he just wanted to look at this again to make sure is doing okay.  He is also complaining of painful elongated toenails.  Objective: Vital signs are stable alert oriented x 3 well-healing toes.  Toenails are long thick yellow dystrophic onychomycotic.  Assessment: Well-healing paronychia.  Pain in limb secondary to onychomycosis.  Plan: Debridement of toenails 1 through 5 bilateral.

## 2023-09-11 ENCOUNTER — Ambulatory Visit: Payer: Medicare Other | Admitting: Podiatry

## 2023-09-17 ENCOUNTER — Institutional Professional Consult (permissible substitution) (INDEPENDENT_AMBULATORY_CARE_PROVIDER_SITE_OTHER): Payer: Medicare Other | Admitting: Otolaryngology

## 2023-10-01 ENCOUNTER — Institutional Professional Consult (permissible substitution) (INDEPENDENT_AMBULATORY_CARE_PROVIDER_SITE_OTHER): Payer: Medicare Other

## 2023-10-02 ENCOUNTER — Ambulatory Visit: Payer: Medicare Other | Admitting: Podiatry

## 2023-10-26 ENCOUNTER — Ambulatory Visit (INDEPENDENT_AMBULATORY_CARE_PROVIDER_SITE_OTHER): Payer: Medicare Other | Admitting: Podiatry

## 2023-10-26 ENCOUNTER — Encounter: Payer: Self-pay | Admitting: Podiatry

## 2023-10-26 DIAGNOSIS — E1142 Type 2 diabetes mellitus with diabetic polyneuropathy: Secondary | ICD-10-CM | POA: Diagnosis not present

## 2023-10-26 DIAGNOSIS — L84 Corns and callosities: Secondary | ICD-10-CM | POA: Diagnosis not present

## 2023-10-26 DIAGNOSIS — M2041 Other hammer toe(s) (acquired), right foot: Secondary | ICD-10-CM | POA: Diagnosis not present

## 2023-10-26 DIAGNOSIS — M2042 Other hammer toe(s) (acquired), left foot: Secondary | ICD-10-CM

## 2023-10-26 DIAGNOSIS — I739 Peripheral vascular disease, unspecified: Secondary | ICD-10-CM

## 2023-10-29 NOTE — Progress Notes (Signed)
       No chief complaint on file.   HPI: 77 y.o. male presents today with primary complaint of hammertoes affecting the lesser digits bilaterally.  The patient is diabetic and does come in for diabetic footcare regularly including nail and callus care.  His diabetes is well-controlled.  Patient denies any nausea, vomiting, fever, chills, chest pain, shortness of breath.  Past Medical History:  Diagnosis Date   Diabetes mellitus    Diverticulitis     Past Surgical History:  Procedure Laterality Date   APPENDECTOMY     CYSTOSCOPY/RETROGRADE/URETEROSCOPY  07/25/2012   Procedure: CYSTOSCOPY/RETROGRADE/URETEROSCOPY;  Surgeon: Antony Haste, MD;  Location: [facility];  Service: Urology;  Laterality: Right;   TOTAL KNEE ARTHROPLASTY  2011   L   TOTAL KNEE ARTHROPLASTY Right 08/28/2016   Procedure: RIGHT TOTAL KNEE ARTHROPLASTY;  Surgeon: Ollen Gross, MD;  Location: [facility];  Service: Orthopedics;  Laterality: Right;    No Known Allergies  ROS negative except as stated in HPI.   Physical Exam: There were no vitals filed for this visit.  General: The patient is alert and oriented x3 in no acute distress.  Dermatology: Skin is warm, dry and supple bilateral lower extremities. Interspaces are clear of maceration and debris.  Hyperkeratotic tissue present to the bilateral hallucal IPJ's and first MPJ regions.  Nails 1 through 5 bilaterally are thickened, elongated, dystrophic with subungual debris, mycotic in appearance  Vascular: Faintly palpable pedal pulses bilaterally. Capillary refill within normal limits.  Pedal hair growth absent no appreciable edema.  No erythema or calor.  Neurological: Protective sensation diminished bilaterally.  Musculoskeletal Exam: Mild bunion deformities present bilaterally.  Hammertoe contractures of lesser digits are present, these are semireducible.   Assessment/Plan of Care: 1. Hammer toes of both feet   2. Diabetic polyneuropathy associated  with type 2 diabetes mellitus (HCC)   3. PAD (peripheral artery disease) (HCC)   4. Callus of foot      No orders of the defined types were placed in this encounter.  None  Discussed clinical findings with patient today.  # Hammertoes of both feet -Buttress pads dispensed for the hammertoes. -Discussed with patient to monitor the sites closely as they could be at high risk of ulceration and skin breakdown secondary to the diabetes and abnormal grounding active force distribution.  # Diabetes with neuropathy, onychomycosis, calluses bilaterally -Patient is currently within the 9-week window for high risk diabetic footcare. -Nails 1 through 5 bilaterally were debrided in thickness and length using aseptic nail nippers and smoothed down using a rotary bur without incident.  Done today as a courtesy - Hyperkeratotic tissue pared down using 312 scalpel blade today as a courtesy -Patient will follow-up in approximately 3 months to resume diabetic footcare.  He may return to clinic sooner if new pedal complaints arise.   Brittne Kawasaki L. Marchia Bond, AACFAS [facility]     [address] [place], [place] [address]                Office [phone]  Fax [phone]

## 2023-11-08 DIAGNOSIS — E118 Type 2 diabetes mellitus with unspecified complications: Secondary | ICD-10-CM | POA: Insufficient documentation

## 2023-11-08 DIAGNOSIS — R072 Precordial pain: Secondary | ICD-10-CM | POA: Insufficient documentation

## 2023-11-08 NOTE — Progress Notes (Signed)
  Cardiology Office Note:   Date:  11/09/2023  ID:  Bradley Gilmore, DOB 03-14-1946, MRN [id number] PCP: Thana Ates, MD  [facility] Providers Cardiologist:  Rollene Rotunda, MD {  History of Present Illness:   Bradley Gilmore is a 77 y.o. male  who was referred by Kirby Funk, MD for evaluation of chest pain.  I sent him for coronary CT and he had mild calcium and moderate CAD with 50 - 69% stenosis in the LAD.  In 2023 he had chest pain and he had a low risk perfusion study.    Since I last saw him he has been doing relatively well.  He walks 1/2-hour a day and he walks his dogs.  He pushes a mower.  He gets some of his care at the [place].  The patient denies any new symptoms such as chest discomfort, neck or arm discomfort. There has been no new shortness of breath, PND or orthopnea. There have been no reported palpitations, presyncope or syncope.   ROS:  As stated in the HPI and negative for all other systems.   Studies Reviewed:    EKG:   EKG Interpretation Date/Time:  Friday November 09 2023 15:33:24 EST Ventricular Rate:  86 PR Interval:  158 QRS Duration:  76 QT Interval:  362 QTC Calculation: 433 R Axis:   17  Text Interpretation: Normal sinus rhythm When compared with ECG of 18-Aug-2016 11:36, No significant change was found Confirmed by Rollene Rotunda ([id number]) on 11/09/2023 4:09:32 PM    Risk Assessment/Calculations:              Physical Exam:   VS:  BP 130/66 (BP Location: Left Arm, Patient Position: Sitting, Cuff Size: Normal)   Pulse 78   Ht 5\' 11"  (1.803 m)   Wt 190 lb (86.2 kg)   SpO2 96%   BMI 26.50 kg/m    Wt Readings from Last 3 Encounters:  11/09/23 190 lb (86.2 kg)  05/24/22 190 lb (86.2 kg)  05/11/22 190 lb 9.6 oz (86.5 kg)     GEN: Well nourished, well developed in no acute distress NECK: No JVD; No carotid bruits CARDIAC: RRR, no murmurs, rubs, gallops RESPIRATORY:  Clear to auscultation without rales, wheezing or rhonchi   ABDOMEN: Soft, non-tender, non-distended EXTREMITIES:  No edema; No deformity   ASSESSMENT AND PLAN:   CAD:   The patient has no new sypmtoms.  No further cardiovascular testing is indicated.  We will continue with aggressive risk reduction and meds as listed.   DM: A1c is 7.9.  He is having his Trulicity uptitrated.  He has follow-up with his primary provider.  He knows he is not at target.  He has been educated about diet.   Dyslipidemia: LDL was 48.  No change in therapy.    Follow up with me in one year.   Signed, Rollene Rotunda, MD

## 2023-11-09 ENCOUNTER — Encounter: Payer: Self-pay | Admitting: Cardiology

## 2023-11-09 ENCOUNTER — Ambulatory Visit: Payer: Medicare Other | Attending: Cardiology | Admitting: Cardiology

## 2023-11-09 VITALS — BP 130/66 | HR 78 | Ht 71.0 in | Wt 190.0 lb

## 2023-11-09 DIAGNOSIS — E118 Type 2 diabetes mellitus with unspecified complications: Secondary | ICD-10-CM

## 2023-11-09 DIAGNOSIS — R072 Precordial pain: Secondary | ICD-10-CM | POA: Diagnosis not present

## 2023-11-09 DIAGNOSIS — E785 Hyperlipidemia, unspecified: Secondary | ICD-10-CM

## 2023-11-09 NOTE — Patient Instructions (Signed)
Medication Instructions:  Your physician recommends that you continue on your current medications as directed. Please refer to the Current Medication list given to you today.  *If you need a refill on your cardiac medications before your next appointment, please call your pharmacy*  Follow-Up: At [facility], you and your health needs are our priority.  As part of our continuing mission to provide you with exceptional heart care, we have created designated Provider Care Teams.  These Care Teams include your primary Cardiologist (physician) and Advanced Practice Providers (APPs -  Physician Assistants and Nurse Practitioners) who all work together to provide you with the care you need, when you need it.  Your next appointment:   12 month(s)  Provider:   Rollene Rotunda, MD

## 2023-11-22 ENCOUNTER — Encounter: Payer: Self-pay | Admitting: Podiatry

## 2023-11-22 ENCOUNTER — Ambulatory Visit: Payer: Medicare Other | Admitting: Podiatry

## 2023-11-22 DIAGNOSIS — E1142 Type 2 diabetes mellitus with diabetic polyneuropathy: Secondary | ICD-10-CM | POA: Diagnosis not present

## 2023-11-22 DIAGNOSIS — D2371 Other benign neoplasm of skin of right lower limb, including hip: Secondary | ICD-10-CM | POA: Diagnosis not present

## 2023-11-22 DIAGNOSIS — I739 Peripheral vascular disease, unspecified: Secondary | ICD-10-CM

## 2023-11-22 DIAGNOSIS — B351 Tinea unguium: Secondary | ICD-10-CM

## 2023-11-22 DIAGNOSIS — M79676 Pain in unspecified toe(s): Secondary | ICD-10-CM | POA: Diagnosis not present

## 2023-11-22 DIAGNOSIS — D2372 Other benign neoplasm of skin of left lower limb, including hip: Secondary | ICD-10-CM

## 2023-11-25 NOTE — Progress Notes (Signed)
He presents today chief complaint of painful elongated toenails and calluses bilateral.  Objective: Pulses are palpable.  Toenails are long thick yellow dystrophic onychomycotic multiple benign skin lesions plantar aspect of the forefoot none preulcerative.  Assessment: Pain in limb secondary to onychomycosis diabetic peripheral neuropathy and benign 9 hyperkeratotic skin lesions.  Plan: Debrided benign skin lesions debrided nails 1 through 5 bilateral follow-up with him in 3 months

## 2024-01-24 ENCOUNTER — Encounter: Payer: Self-pay | Admitting: Podiatry

## 2024-01-24 ENCOUNTER — Ambulatory Visit: Payer: Medicare Other | Admitting: Podiatry

## 2024-01-24 DIAGNOSIS — E1142 Type 2 diabetes mellitus with diabetic polyneuropathy: Secondary | ICD-10-CM | POA: Diagnosis not present

## 2024-01-24 DIAGNOSIS — M79676 Pain in unspecified toe(s): Secondary | ICD-10-CM | POA: Diagnosis not present

## 2024-01-24 DIAGNOSIS — B351 Tinea unguium: Secondary | ICD-10-CM

## 2024-01-24 DIAGNOSIS — D2372 Other benign neoplasm of skin of left lower limb, including hip: Secondary | ICD-10-CM

## 2024-01-24 DIAGNOSIS — Z599 Problem related to housing and economic circumstances, unspecified: Secondary | ICD-10-CM | POA: Insufficient documentation

## 2024-01-24 DIAGNOSIS — D2371 Other benign neoplasm of skin of right lower limb, including hip: Secondary | ICD-10-CM | POA: Diagnosis not present

## 2024-01-24 DIAGNOSIS — Z77098 Contact with and (suspected) exposure to other hazardous, chiefly nonmedicinal, chemicals: Secondary | ICD-10-CM | POA: Insufficient documentation

## 2024-01-24 DIAGNOSIS — D649 Anemia, unspecified: Secondary | ICD-10-CM | POA: Insufficient documentation

## 2024-01-24 DIAGNOSIS — I739 Peripheral vascular disease, unspecified: Secondary | ICD-10-CM | POA: Diagnosis not present

## 2024-01-24 DIAGNOSIS — E1140 Type 2 diabetes mellitus with diabetic neuropathy, unspecified: Secondary | ICD-10-CM | POA: Insufficient documentation

## 2024-01-24 NOTE — Progress Notes (Signed)
 He presents today chief complaint of painful elongated toenails and calluses bilateral.  Objective: Pulses are palpable bilateral.  Benign skin lesions plantar aspect of the forefoot bilateral.  No erythema edema cellulitis drainage or odor.  Assessment: Pain limb secondary to onychomycosis.  Plan: Debridement of benign skin lesion debridement nails bilateral.

## 2024-02-21 ENCOUNTER — Ambulatory Visit: Payer: Medicare Other | Admitting: Podiatry

## 2024-02-21 ENCOUNTER — Encounter: Payer: Self-pay | Admitting: Podiatry

## 2024-02-21 DIAGNOSIS — D2371 Other benign neoplasm of skin of right lower limb, including hip: Secondary | ICD-10-CM

## 2024-02-21 DIAGNOSIS — M79676 Pain in unspecified toe(s): Secondary | ICD-10-CM

## 2024-02-21 DIAGNOSIS — D2372 Other benign neoplasm of skin of left lower limb, including hip: Secondary | ICD-10-CM

## 2024-02-21 DIAGNOSIS — M7741 Metatarsalgia, right foot: Secondary | ICD-10-CM

## 2024-02-21 DIAGNOSIS — E1142 Type 2 diabetes mellitus with diabetic polyneuropathy: Secondary | ICD-10-CM | POA: Diagnosis not present

## 2024-02-21 DIAGNOSIS — B351 Tinea unguium: Secondary | ICD-10-CM

## 2024-02-21 DIAGNOSIS — M7742 Metatarsalgia, left foot: Secondary | ICD-10-CM

## 2024-02-21 DIAGNOSIS — I739 Peripheral vascular disease, unspecified: Secondary | ICD-10-CM

## 2024-02-21 NOTE — Progress Notes (Signed)
 He presents today for follow-up of his diabetes and neuropathy foot deformity and forefoot pain particularly right over left.  He states that her tendon his toenails are long.  Objective: Vital signs are stable alert oriented x 3.  Pulses are minimally palpable..  Severe hallux valgus deformity and hammertoe deformities bilateral.  Metatarsalgia with pain on palpation of the metatarsophalangeal joints.  Toenails are long thick yellow dystrophic with multiple benign skin lesions forefoot bilateral.  Neuropathy still demonstrates some neuropathic symptomatology.  No open lesions or wounds.  Assessment: Diabetes mellitus with digital deformities.  Peripheral vascular disease.  Metatarsalgia.  Pain in limb secondary to onychomycosis.  Pain in limb secondary to benign skin lesions.  Plan: Discussed etiology pathology conservative versus surgical therapies.  Debrided toenails today and debrided benign skin lesions he will follow-up with Trish for orthotics.  He states that his insurance will pay for orthotics.  I discussed this with him but I am not sure that they will.  He has had diabetic shoes in the past.  However at this point he needs a metatarsal pad just behind the head of the metatarsals to help offload the forefoot whether he can obtain this with a diabetic shoe or a custom orthotic.

## 2024-03-03 ENCOUNTER — Ambulatory Visit

## 2024-03-03 DIAGNOSIS — M7741 Metatarsalgia, right foot: Secondary | ICD-10-CM

## 2024-03-03 DIAGNOSIS — M2041 Other hammer toe(s) (acquired), right foot: Secondary | ICD-10-CM

## 2024-03-03 DIAGNOSIS — M2141 Flat foot [pes planus] (acquired), right foot: Secondary | ICD-10-CM

## 2024-03-03 DIAGNOSIS — M2011 Hallux valgus (acquired), right foot: Secondary | ICD-10-CM

## 2024-03-03 NOTE — Progress Notes (Signed)
 check_circle Notification or Prior Authorization is not required for the requested services You are not required to submit a notification/prior authorization based on the information provided. If you have general questions about the prior authorization requirements, visit [email or web address] > Clinician Resources > Advance and Admission Notification Requirements. The number above acknowledges your notification. Please write this reference number down for future reference. If you would like to request an organization determination, please call us at [phone]. Decision ID #: [id number] The number above acknowledges your inquiry and our response. Please write this number down and refer to it for future inquiries. Coverage and payment for an item or service is governed by the member's benefit plan document, and, if applicable, the provider's participation agreement with the Health Plan.  Above [facility] predetermination    Patient was present and evaluated for Custom molded foot orthotics. Patient will benefit from CFO's to provide total contact to BIL MLA's helping to balance and distribute body weight more evenly across BIL feet helping to reduce plantar pressure and pain. Orthotic will also encourage FF / RF alignment  Patient was scanned today and will return for fitting upon receipt

## 2024-03-14 ENCOUNTER — Ambulatory Visit (INDEPENDENT_AMBULATORY_CARE_PROVIDER_SITE_OTHER)

## 2024-03-14 DIAGNOSIS — M2041 Other hammer toe(s) (acquired), right foot: Secondary | ICD-10-CM

## 2024-03-14 DIAGNOSIS — M7742 Metatarsalgia, left foot: Secondary | ICD-10-CM | POA: Diagnosis not present

## 2024-03-14 DIAGNOSIS — M7741 Metatarsalgia, right foot: Secondary | ICD-10-CM | POA: Diagnosis not present

## 2024-03-14 DIAGNOSIS — M2141 Flat foot [pes planus] (acquired), right foot: Secondary | ICD-10-CM

## 2024-03-14 NOTE — Progress Notes (Signed)
 Patient presents today to pick up custom molded foot orthotics, diagnosed with Hammer toes, Pes Planus and Metatarsalgia  by Dr. Ardelle Anton.   Orthotics were dispensed and fit was satisfactory. Reviewed instructions for break-in and wear. Written instructions given to patient.  Patient will follow up as needed.  [name] Cped, CFo, CFm

## 2024-04-15 ENCOUNTER — Encounter: Payer: Self-pay | Admitting: Podiatry

## 2024-04-15 ENCOUNTER — Ambulatory Visit (INDEPENDENT_AMBULATORY_CARE_PROVIDER_SITE_OTHER): Admitting: Podiatry

## 2024-04-15 DIAGNOSIS — D2372 Other benign neoplasm of skin of left lower limb, including hip: Secondary | ICD-10-CM | POA: Diagnosis not present

## 2024-04-15 DIAGNOSIS — D2371 Other benign neoplasm of skin of right lower limb, including hip: Secondary | ICD-10-CM

## 2024-04-15 DIAGNOSIS — E1142 Type 2 diabetes mellitus with diabetic polyneuropathy: Secondary | ICD-10-CM

## 2024-04-15 DIAGNOSIS — M79676 Pain in unspecified toe(s): Secondary | ICD-10-CM

## 2024-04-15 DIAGNOSIS — B351 Tinea unguium: Secondary | ICD-10-CM

## 2024-04-15 DIAGNOSIS — M7751 Other enthesopathy of right foot: Secondary | ICD-10-CM | POA: Diagnosis not present

## 2024-04-15 MED ORDER — TRIAMCINOLONE ACETONIDE 40 MG/ML IJ SUSP
20.0000 mg | Freq: Once | INTRAMUSCULAR | Status: AC
  Administered 2024-04-15: 20 mg

## 2024-04-15 NOTE — Progress Notes (Signed)
 He presents presents to the office today with a chief concern of painful elongated toenails of a painful lesion To capsulitis second metatarsophalangeal with hammertoe deformity right foot.  He also has painful calluses bilateral.  Objective: Vitals are stable alert oriented x 3.  Pulses are palpable.  Neurologic sensorium is intact.  DP reflexes are intact.  Hammertoe deformity with hallux valgus deformity on the right foot.  Pain on end range of motion of the second metatarsophalangeal joint with rigid hammertoe deformity.  Thick mycotic nails 1 through 5 bilaterally toenails are thick yellow dystrophic with mycotic.  Assessment: Pain in limb secondary to capsulitis second metatarsophalangeal joint of the right foot hammertoe deformity second digit right foot benign skin lesion plantar aspect bilateral foot and pain in limb secondary onychomycosis.  Plan: Debrided benign skin lesions debrided mycotic nails.  Injected around the second metatarsophalangeal joint today Kenalog  and local anesthetic.

## 2024-04-22 ENCOUNTER — Ambulatory Visit: Payer: Medicare Other | Admitting: Podiatry

## 2024-07-04 ENCOUNTER — Encounter: Payer: Self-pay | Admitting: Advanced Practice Midwife

## 2024-07-15 ENCOUNTER — Ambulatory Visit (INDEPENDENT_AMBULATORY_CARE_PROVIDER_SITE_OTHER): Admitting: Podiatry

## 2024-07-15 DIAGNOSIS — B351 Tinea unguium: Secondary | ICD-10-CM | POA: Diagnosis not present

## 2024-07-15 DIAGNOSIS — D2372 Other benign neoplasm of skin of left lower limb, including hip: Secondary | ICD-10-CM

## 2024-07-15 DIAGNOSIS — M79676 Pain in unspecified toe(s): Secondary | ICD-10-CM | POA: Diagnosis not present

## 2024-07-15 DIAGNOSIS — D2371 Other benign neoplasm of skin of right lower limb, including hip: Secondary | ICD-10-CM | POA: Diagnosis not present

## 2024-07-15 NOTE — Progress Notes (Signed)
 He presents presents to the office today with a chief concern of painful elongated toenails of a painful lesion To capsulitis second metatarsophalangeal with hammertoe deformity right foot.  He also has painful calluses bilateral.  Objective: Vitals are stable alert oriented x 3.  Pulses are palpable.  Neurologic sensorium is intact.  DP reflexes are intact.  Hammertoe deformity with hallux valgus deformity on the right foot.  Pain on end range of motion of the second metatarsophalangeal joint with rigid hammertoe deformity.  Thick mycotic nails 1 through 5 bilaterally toenails are thick yellow dystrophic with mycotic.  Assessment: Pain in limb secondary to capsulitis second metatarsophalangeal joint of the right foot hammertoe deformity second digit right foot benign skin lesion plantar aspect bilateral foot and pain in limb secondary onychomycosis.  Plan: Debrided benign skin lesions debrided mycotic nails.  I.

## 2024-09-23 ENCOUNTER — Encounter: Payer: Self-pay | Admitting: Podiatrist

## 2024-09-23 ENCOUNTER — Ambulatory Visit: Admitting: Podiatry

## 2024-09-23 ENCOUNTER — Ambulatory Visit (INDEPENDENT_AMBULATORY_CARE_PROVIDER_SITE_OTHER): Admitting: Podiatrist

## 2024-09-23 DIAGNOSIS — B351 Tinea unguium: Secondary | ICD-10-CM | POA: Diagnosis not present

## 2024-09-23 DIAGNOSIS — M79676 Pain in unspecified toe(s): Secondary | ICD-10-CM

## 2024-09-23 NOTE — Progress Notes (Signed)
 He presents presents to the office today with a chief concern of painful elongated toenails of a painful lesion To capsulitis second metatarsophalangeal with hammertoe deformity right foot.  He also has painful calluses bilateral.  Objective: Vitals are stable alert oriented x 3.  Pulses are palpable.  Neurologic sensorium is intact.  DP reflexes are intact.  Hammertoe deformity with hallux valgus deformity on the right foot.  Pain on end range of motion of the second metatarsophalangeal joint with rigid hammertoe deformity.  Thick mycotic nails 1 through 5 bilaterally toenails are thick yellow dystrophic with mycotic.  Assessment: Pain in limb secondary to capsulitis second metatarsophalangeal joint of the right foot hammertoe deformity second digit right foot benign skin lesion plantar aspect bilateral foot and pain in limb secondary onychomycosis.  Plan: Debrided benign skin lesions debrided mycotic nails with sterile nail nipper and sterile burr.  He will return in 3 months for follow up.

## 2024-10-14 ENCOUNTER — Ambulatory Visit: Admitting: Podiatry

## 2024-11-25 ENCOUNTER — Ambulatory Visit: Admitting: Podiatry

## 2024-11-27 ENCOUNTER — Encounter: Payer: Self-pay | Admitting: Podiatry

## 2024-11-27 ENCOUNTER — Ambulatory Visit: Admitting: Podiatry

## 2024-11-27 DIAGNOSIS — M79676 Pain in unspecified toe(s): Secondary | ICD-10-CM | POA: Diagnosis not present

## 2024-11-27 DIAGNOSIS — E1142 Type 2 diabetes mellitus with diabetic polyneuropathy: Secondary | ICD-10-CM | POA: Diagnosis not present

## 2024-11-27 DIAGNOSIS — L84 Corns and callosities: Secondary | ICD-10-CM | POA: Diagnosis not present

## 2024-11-27 DIAGNOSIS — B351 Tinea unguium: Secondary | ICD-10-CM

## 2024-11-27 DIAGNOSIS — I739 Peripheral vascular disease, unspecified: Secondary | ICD-10-CM

## 2024-11-27 NOTE — Progress Notes (Signed)
°  Subjective:  Patient ID: Bradley Gilmore, male    DOB: 04/07/1946,  MRN: [id number]  Chief Complaint  Patient presents with   [facility]    [facility] nail trim   A1c 7.4 pt stated.      78 y.o. male presents with the above complaint. History confirmed with patient. Patient presenting with pain related to dystrophic thickened elongated nails. Patient is unable to trim own nails related to nail dystrophy and/or mobility issues. Patient does have a history of T2DM.  Last A1c 7.4.  Patient does have painful calluses present plantar medial first MPJ region and first toe IPJ region  Objective:  Physical Exam: warm, good capillary refill, decreased pedal hair growth, pedal skin atrophic nail exam onychomycosis of the toenails, onycholysis, and dystrophic nails greater than 3 mm thickening DP pulses palpable, PT pulses palpable, protective sensation intact, and vibratory sensation diminished Left Foot:  Pain with palpation of nails due to elongation and dystrophic growth.  Hyperkeratotic tissue present plantar medial interphalangeal joint first toe and plantar medial first MPJ Right Foot: Pain with palpation of nails due to elongation and dystrophic growth. Hyperkeratotic tissue present plantar medial interphalangeal joint first toe and plantar medial first MPJ  Assessment:   1. Pain due to onychomycosis of toenail   2. Diabetic polyneuropathy associated with type 2 diabetes mellitus (HCC)   3. Callus of foot   4. PAD (peripheral artery disease)      Plan:  Patient was evaluated and treated and all questions answered.  #Hyperkeratotic lesions/pre ulcerative calluses present x 4 plantar medial first toe interphalange joint and peroneal first IPJ All symptomatic hyperkeratoses x 4 separate lesions were safely debrided with a sterile #312 blade to patient's level of comfort without incident. We discussed preventative and palliative care of these lesions including supportive and accommodative shoegear,  padding, prefabricated and custom molded accommodative orthoses, use of a pumice stone and lotions/creams daily. - Discussed use of over-the-counter urea cream to help manage the calluses  #Onychomycosis with pain  -Nails palliatively debrided as below. -Educated on self-care  Procedure: Nail Debridement Rationale: Pain Type of Debridement: manual, sharp debridement. Instrumentation: Nail nipper, rotary burr. Number of Nails: 10  Patient educated on diabetes. Discussed proper diabetic foot care and discussed risks and complications of disease. Educated patient in depth on reasons to return to the office immediately should he/she discover anything concerning or new on the feet. All questions answered. Discussed proper shoes as well.    Return in about 9 weeks (around 01/29/2025) for Diabetic Foot Care.         Ethan Saddler, DPM [facility] / [facility] [place]

## 2024-11-27 NOTE — Patient Instructions (Signed)
 Look for urea 40% cream or ointment and apply to the thickened dry skin / calluses. This can be bought over the counter, at a pharmacy or online such as [employer].

## 2025-01-29 ENCOUNTER — Ambulatory Visit: Admitting: Podiatry
